# Patient Record
Sex: Female | Born: 1985 | Race: White | Hispanic: No | Marital: Single | Smoking: Current every day smoker
Health system: Southern US, Community
[De-identification: ages and names within clinical notes are randomized; demographics above are authoritative.]

## PROBLEM LIST (undated history)

## (undated) DIAGNOSIS — C419 Malignant neoplasm of bone and articular cartilage, unspecified: Secondary | ICD-10-CM

## (undated) DIAGNOSIS — Z765 Malingerer [conscious simulation]: Secondary | ICD-10-CM

## (undated) DIAGNOSIS — G8929 Other chronic pain: Secondary | ICD-10-CM

## (undated) DIAGNOSIS — F1120 Opioid dependence, uncomplicated: Secondary | ICD-10-CM

## (undated) DIAGNOSIS — B1920 Unspecified viral hepatitis C without hepatic coma: Secondary | ICD-10-CM

## (undated) DIAGNOSIS — N200 Calculus of kidney: Secondary | ICD-10-CM

## (undated) DIAGNOSIS — F429 Obsessive-compulsive disorder, unspecified: Secondary | ICD-10-CM

## (undated) DIAGNOSIS — N289 Disorder of kidney and ureter, unspecified: Secondary | ICD-10-CM

## (undated) DIAGNOSIS — F419 Anxiety disorder, unspecified: Secondary | ICD-10-CM

## (undated) DIAGNOSIS — F32A Depression, unspecified: Secondary | ICD-10-CM

## (undated) DIAGNOSIS — F329 Major depressive disorder, single episode, unspecified: Secondary | ICD-10-CM

## (undated) HISTORY — PX: LITHOTRIPSY: SUR834

## (undated) HISTORY — DX: Unspecified viral hepatitis C without hepatic coma: B19.20

## (undated) HISTORY — PX: LEG SURGERY: SHX1003

## (undated) HISTORY — PX: BONE CYST EXCISION: SHX376

## (undated) HISTORY — PX: HIP SURGERY: SHX245

## (undated) HISTORY — PX: CYSTOSCOPY: SUR368

---

## 2008-02-25 ENCOUNTER — Telehealth (INDEPENDENT_AMBULATORY_CARE_PROVIDER_SITE_OTHER): Payer: Self-pay | Admitting: *Deleted

## 2008-02-25 ENCOUNTER — Ambulatory Visit: Payer: Self-pay | Admitting: Family Medicine

## 2008-02-29 ENCOUNTER — Telehealth: Payer: Self-pay | Admitting: Occupational Medicine

## 2008-03-01 ENCOUNTER — Ambulatory Visit: Payer: Self-pay | Admitting: Occupational Medicine

## 2008-06-26 ENCOUNTER — Emergency Department (HOSPITAL_COMMUNITY): Admission: EM | Admit: 2008-06-26 | Discharge: 2008-06-26 | Payer: Self-pay | Admitting: Emergency Medicine

## 2008-07-16 ENCOUNTER — Emergency Department (HOSPITAL_BASED_OUTPATIENT_CLINIC_OR_DEPARTMENT_OTHER): Admission: EM | Admit: 2008-07-16 | Discharge: 2008-07-16 | Payer: Self-pay | Admitting: Emergency Medicine

## 2008-07-16 ENCOUNTER — Ambulatory Visit: Payer: Self-pay | Admitting: Diagnostic Radiology

## 2008-12-09 ENCOUNTER — Emergency Department (HOSPITAL_COMMUNITY): Admission: EM | Admit: 2008-12-09 | Discharge: 2008-12-09 | Payer: Self-pay | Admitting: Emergency Medicine

## 2009-07-16 ENCOUNTER — Emergency Department (HOSPITAL_BASED_OUTPATIENT_CLINIC_OR_DEPARTMENT_OTHER): Admission: EM | Admit: 2009-07-16 | Discharge: 2009-07-16 | Payer: Self-pay | Admitting: Emergency Medicine

## 2009-07-17 ENCOUNTER — Emergency Department (HOSPITAL_COMMUNITY): Admission: EM | Admit: 2009-07-17 | Discharge: 2009-07-17 | Payer: Self-pay | Admitting: Emergency Medicine

## 2009-07-21 ENCOUNTER — Emergency Department (HOSPITAL_BASED_OUTPATIENT_CLINIC_OR_DEPARTMENT_OTHER): Admission: EM | Admit: 2009-07-21 | Discharge: 2009-07-21 | Payer: Self-pay | Admitting: Emergency Medicine

## 2010-08-29 LAB — URINE CULTURE
Colony Count: NO GROWTH
Culture: NO GROWTH

## 2010-08-29 LAB — URINALYSIS, ROUTINE W REFLEX MICROSCOPIC
Bilirubin Urine: NEGATIVE
Bilirubin Urine: NEGATIVE
Glucose, UA: NEGATIVE mg/dL
Glucose, UA: NEGATIVE mg/dL
Hgb urine dipstick: NEGATIVE
Hgb urine dipstick: NEGATIVE
Ketones, ur: NEGATIVE mg/dL
Ketones, ur: NEGATIVE mg/dL
Nitrite: NEGATIVE
Nitrite: NEGATIVE
Protein, ur: NEGATIVE mg/dL
Protein, ur: NEGATIVE mg/dL
Specific Gravity, Urine: 1.004 — ABNORMAL LOW (ref 1.005–1.030)
Specific Gravity, Urine: 1.004 — ABNORMAL LOW (ref 1.005–1.030)
Urobilinogen, UA: 0.2 mg/dL (ref 0.0–1.0)
Urobilinogen, UA: 0.2 mg/dL (ref 0.0–1.0)
pH: 6 (ref 5.0–8.0)
pH: 6.5 (ref 5.0–8.0)

## 2010-08-29 LAB — URINE MICROSCOPIC-ADD ON

## 2010-08-29 LAB — PREGNANCY, URINE: Preg Test, Ur: NEGATIVE

## 2010-09-17 LAB — POCT PREGNANCY, URINE: Preg Test, Ur: NEGATIVE

## 2010-09-17 LAB — URINALYSIS, ROUTINE W REFLEX MICROSCOPIC
Bilirubin Urine: NEGATIVE
Glucose, UA: NEGATIVE mg/dL
Ketones, ur: NEGATIVE mg/dL
Nitrite: NEGATIVE
Protein, ur: NEGATIVE mg/dL
Specific Gravity, Urine: 1.022 (ref 1.005–1.030)
Urobilinogen, UA: 1 mg/dL (ref 0.0–1.0)
pH: 6 (ref 5.0–8.0)

## 2010-09-17 LAB — URINE MICROSCOPIC-ADD ON

## 2010-09-24 LAB — URINALYSIS, ROUTINE W REFLEX MICROSCOPIC
Bilirubin Urine: NEGATIVE
Glucose, UA: NEGATIVE mg/dL
Ketones, ur: NEGATIVE mg/dL
Leukocytes, UA: NEGATIVE
Nitrite: NEGATIVE
Protein, ur: NEGATIVE mg/dL
Specific Gravity, Urine: 1.028 (ref 1.005–1.030)
Urobilinogen, UA: 1 mg/dL (ref 0.0–1.0)
pH: 6.5 (ref 5.0–8.0)

## 2010-09-24 LAB — URINE MICROSCOPIC-ADD ON

## 2010-09-24 LAB — POCT PREGNANCY, URINE: Preg Test, Ur: NEGATIVE

## 2010-09-25 LAB — URINALYSIS, ROUTINE W REFLEX MICROSCOPIC
Bilirubin Urine: NEGATIVE
Glucose, UA: NEGATIVE mg/dL
Ketones, ur: NEGATIVE mg/dL
Leukocytes, UA: NEGATIVE
Nitrite: NEGATIVE
Protein, ur: NEGATIVE mg/dL
Specific Gravity, Urine: 1.027 (ref 1.005–1.030)
Urobilinogen, UA: 1 mg/dL (ref 0.0–1.0)
pH: 6 (ref 5.0–8.0)

## 2010-09-25 LAB — URINE MICROSCOPIC-ADD ON

## 2010-09-25 LAB — PREGNANCY, URINE: Preg Test, Ur: NEGATIVE

## 2011-06-11 DIAGNOSIS — B1920 Unspecified viral hepatitis C without hepatic coma: Secondary | ICD-10-CM

## 2011-06-11 HISTORY — DX: Unspecified viral hepatitis C without hepatic coma: B19.20

## 2012-01-29 ENCOUNTER — Ambulatory Visit (INDEPENDENT_AMBULATORY_CARE_PROVIDER_SITE_OTHER): Payer: Self-pay | Admitting: Sports Medicine

## 2012-01-29 ENCOUNTER — Encounter: Payer: Self-pay | Admitting: Sports Medicine

## 2012-01-29 VITALS — BP 109/68 | HR 90 | Temp 98.4°F | Resp 18 | Ht 62.5 in | Wt 103.0 lb

## 2012-01-29 DIAGNOSIS — M549 Dorsalgia, unspecified: Secondary | ICD-10-CM

## 2012-01-29 DIAGNOSIS — G8929 Other chronic pain: Secondary | ICD-10-CM

## 2012-01-29 DIAGNOSIS — F39 Unspecified mood [affective] disorder: Secondary | ICD-10-CM

## 2012-01-29 MED ORDER — OXYMORPHONE HCL 5 MG PO TABS: 5.0000 mg | ORAL_TABLET | ORAL | Status: DC | PRN

## 2012-01-29 MED ORDER — CITALOPRAM HYDROBROMIDE 10 MG PO TABS: 10.0000 mg | ORAL_TABLET | Freq: Every day | ORAL | Status: DC

## 2012-01-29 MED ORDER — OXYCODONE HCL 15 MG PO TABS: 15.0000 mg | ORAL_TABLET | Freq: Four times a day (QID) | ORAL | Status: DC | PRN

## 2012-01-29 MED ORDER — OXYCODONE HCL 15 MG PO TABS: 15.0000 mg | ORAL_TABLET | ORAL | Status: DC | PRN

## 2012-01-29 MED ORDER — OXYMORPHONE HCL 5 MG PO TABS: 5.0000 mg | ORAL_TABLET | Freq: Two times a day (BID) | ORAL | Status: AC

## 2012-01-29 NOTE — Progress Notes (Signed)
Patient ID: Beth Berg, female   DOB: 02-20-1986, 26 y.o.   MRN: [id number] Subjective:    CC: Establish care.   HPI: Beth Berg is a pleasant 26 year old female who comes in to establish care, as well as discuss her back pain. 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 Bone graft was taken from her left iliac wing. This was all performed at [facility].Since then she's had persistent pain, to the point where she has been on high doses of narcotic pain medication. She has come down on her dosages and is currently taking oxycodone 15 mg immediate release 4 times a day, and opana 5 mg twice a day.  She has had physical therapy, MRIs, injections, none of which have helped. Currently she has an appointment with the pain clinic on September 5. She's desiring refills on her pain medication until then.  Mood disorder: She has been on Zoloft 50 mg twice a day, but unfortunately has been off of it recently. She endorses a large amount of anxiety, as well as depressed mood. She denies flight of ideas, manic thoughts, decreased need for sleep, or spending outside of means.  She denied suicidal or homicidal ideation.  Past medical history, Surgical history, Family history, Social history, Allergies, and medications have been entered into the medical record, reviewed, and no changes needed.   Review of Systems: No headache, visual changes, nausea, vomiting, diarrhea, constipation, dizziness, abdominal pain, skin rash, fevers, chills, night sweats, weight loss, chest pain, body aches, joint swelling, muscle aches, or shortness of breath.   Objective:    General: Well Developed, well nourished, and in no acute distress.  Neuro: Alert and oriented x3, extra-ocular muscles intact.  HEENT: Normocephalic, atraumatic, pupils equal round reactive to light, neck supple, no masses, no lymphadenopathy, thyroid nonpalpable.  Skin: Warm and dry, no rashes noted.  Cardiac:  Regular rate and rhythm, no murmurs rubs or gallops.  Respiratory: Clear to auscultation bilaterally. Not using accessory muscles, speaking in full sentences.  Abdominal: Soft, nontender, nondistended, positive bowel sounds, no masses, no organomegaly.  Musculoskeletal: incisions noted on distal fibula, as well as left-sided back from bone graft. She endorses exquisite tenderness over these areas.  Impression and Recommendations:

## 2012-01-29 NOTE — Addendum Note (Signed)
Addended by: Monica Becton on: 01/29/2012 04:30 PM   Modules accepted: Medications

## 2012-01-29 NOTE — Assessment & Plan Note (Signed)
Mixed depression/anxiety. I will start citalopram 10 mg daily. She will come back to see me in 2 weeks to reassess her symptoms, and possibly titrate medication dose.

## 2012-01-29 NOTE — Assessment & Plan Note (Addendum)
Certainly she's become tolerant to narcotic medications. I would like to help her out with her pain, I am refilling her opana, and oxycodone. She understands that these are one time only prescription. She's been given a month's supply, and understands from our conversation the office visit that she will continue this at the pain clinic. I think that her pain will also improve significantly with her mood disorder under control with an SSRI. I have asked her to call [facility], and get her injection records.

## 2012-01-30 ENCOUNTER — Telehealth: Payer: Self-pay

## 2012-01-30 NOTE — Telephone Encounter (Signed)
Discussed with patient.  She requested additional Opana.  Advised it was my policy for one time scripts for non-acute pain.  She states she has pain clinic appointment scheduled for approximately 2 weeks. She will keep her appointment with me afterwards. She understands no additional narcotics from this practice. She also understands I will not prescribe Valium without trial of other medication.

## 2012-01-30 NOTE — Telephone Encounter (Signed)
She wants to talk to you about the celexa, the pain medication and about her job. She would not tell me anything else. She wants to talk with Dr Benjamin Stain.

## 2012-02-14 ENCOUNTER — Ambulatory Visit: Payer: Self-pay | Admitting: Sports Medicine

## 2012-09-06 ENCOUNTER — Emergency Department (HOSPITAL_BASED_OUTPATIENT_CLINIC_OR_DEPARTMENT_OTHER)
Admission: EM | Admit: 2012-09-06 | Discharge: 2012-09-06 | Disposition: A | Discharge status: Home / Self Care | Payer: Self-pay | Attending: Emergency Medicine | Admitting: Emergency Medicine

## 2012-09-06 ENCOUNTER — Encounter (HOSPITAL_BASED_OUTPATIENT_CLINIC_OR_DEPARTMENT_OTHER): Payer: Self-pay

## 2012-09-06 DIAGNOSIS — M25559 Pain in unspecified hip: Secondary | ICD-10-CM | POA: Insufficient documentation

## 2012-09-06 DIAGNOSIS — M25552 Pain in left hip: Secondary | ICD-10-CM

## 2012-09-06 DIAGNOSIS — G8929 Other chronic pain: Secondary | ICD-10-CM | POA: Insufficient documentation

## 2012-09-06 DIAGNOSIS — F17200 Nicotine dependence, unspecified, uncomplicated: Secondary | ICD-10-CM | POA: Insufficient documentation

## 2012-09-06 DIAGNOSIS — Z9889 Other specified postprocedural states: Secondary | ICD-10-CM | POA: Insufficient documentation

## 2012-09-06 DIAGNOSIS — Z85830 Personal history of malignant neoplasm of bone: Secondary | ICD-10-CM | POA: Insufficient documentation

## 2012-09-06 HISTORY — DX: Malignant neoplasm of bone and articular cartilage, unspecified: C41.9

## 2012-09-06 MED ORDER — LORAZEPAM 1 MG PO TABS: 1.0000 mg | ORAL_TABLET | Freq: Three times a day (TID) | ORAL | Status: AC | PRN

## 2012-09-06 MED ORDER — OXYCODONE-ACETAMINOPHEN 5-325 MG PO TABS
2.0000 | ORAL_TABLET | Freq: Once | ORAL | Status: AC
  Administered 2012-09-06: 2 via ORAL
  Filled 2012-09-06 (×2): qty 2

## 2012-09-06 NOTE — ED Notes (Signed)
Patient reports that she has lengthy hx of osteosarcoma and just got married yesterday and developed severe left hip pain for the past 6 hours. Here for pain management, to start pain clinic 1 week

## 2012-09-06 NOTE — ED Provider Notes (Signed)
History     CSN: [id number]  Arrival date & time 09/06/12  0629   First MD Initiated Contact with Patient 09/06/12 [phone]      Chief Complaint  Patient presents with  . Hip Pain     Patient is a 27 y.o. female presenting with hip pain. The history is provided by the patient.  Hip Pain This is a chronic problem. The current episode started more than 1 week ago. The problem occurs constantly. The problem has been gradually worsening. Pertinent negatives include no chest pain and no shortness of breath. Exacerbated by: walking. The symptoms are relieved by rest. She has tried rest for the symptoms. The treatment provided no relief.  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 She reports she just got married yesterday and is flying to [place] in two hours for her honeymoon.  She reports she will f/u with pain clinic in one week when she returns from [place].    She denies falls.  She denies recent injury to her hips.  She can ambulate.  No fever.  No weakness reported.  No abdominal pain reported.    Past Medical History  Diagnosis Date  . Osteosarcoma     History reviewed. No pertinent past surgical history.  No family history on file.  History  Substance Use Topics  . Smoking status: Current Every Day Smoker    Types: Cigarettes  . Smokeless tobacco: Not on file  . Alcohol Use: No    OB History   Grav Para Term Preterm Abortions TAB SAB Ect Mult Living                  Review of Systems  Constitutional: Negative for fever.  Respiratory: Negative for shortness of breath.   Cardiovascular: Negative for chest pain.  Musculoskeletal: Negative for back pain and joint swelling.  Neurological: Negative for weakness.    Allergies  Zofran  Home Medications   Current Outpatient Rx  Name  Route   Sig  Dispense  Refill  . ALPRAZolam (XANAX) 1 MG tablet   Oral   Take 1 mg by mouth at bedtime as needed for sleep.         Marland Kitchen oxyCODONE (ROXICODONE) 15 MG immediate release tablet   Oral   Take 15 mg by mouth every 6 (six) hours as needed for pain.         Marland Kitchen LORazepam (ATIVAN) 1 MG tablet   Oral   Take 1 tablet (1 mg total) by mouth every 8 (eight) hours as needed for anxiety.   10 tablet   0     BP 127/72  Pulse 123  Temp(Src) 97.5 F (36.4 C) (Oral)  SpO2 100%  Physical Exam CONSTITUTIONAL: Well developed/well nourished, mildly anxious HEAD: Normocephalic/atraumatic EYES: EOMI/PERRL ENMT: Mucous membranes moist NECK: supple no meningeal signs SPINE:entire spine nontender CV: S1/S2 noted, no murmurs/rubs/gallops noted LUNGS: Lungs are clear to auscultation bilaterally, no apparent distress ABDOMEN: soft, nontender, no rebound or guarding GU:no cva tenderness NEURO: Pt is awake/alert, moves all extremitiesx4. Pt can ambulate without difficulty EXTREMITIES: pulses normal, full ROM. No deformity noted to left LE.  Full ROM of left hip and left knee does not elicit any tenderness.  No warmth noted over left hip.  SKIN: warm, color normal PSYCH: mildly anxious  ED Course  Procedures    1. 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  She is tachycardic, but suspect due to anxiety/pain        Joya Gaskins, MD 09/06/12 [phone]

## 2013-02-21 ENCOUNTER — Emergency Department (HOSPITAL_BASED_OUTPATIENT_CLINIC_OR_DEPARTMENT_OTHER)
Admission: EM | Admit: 2013-02-21 | Discharge: 2013-02-21 | Discharge status: Against Medical Advice | Payer: Self-pay | Attending: Emergency Medicine | Admitting: Emergency Medicine

## 2013-02-21 ENCOUNTER — Encounter (HOSPITAL_BASED_OUTPATIENT_CLINIC_OR_DEPARTMENT_OTHER): Payer: Self-pay | Admitting: Emergency Medicine

## 2013-02-21 DIAGNOSIS — M549 Dorsalgia, unspecified: Secondary | ICD-10-CM | POA: Insufficient documentation

## 2013-02-21 DIAGNOSIS — M25559 Pain in unspecified hip: Secondary | ICD-10-CM | POA: Insufficient documentation

## 2013-02-21 DIAGNOSIS — Z87448 Personal history of other diseases of urinary system: Secondary | ICD-10-CM | POA: Insufficient documentation

## 2013-02-21 DIAGNOSIS — G8929 Other chronic pain: Secondary | ICD-10-CM | POA: Insufficient documentation

## 2013-02-21 DIAGNOSIS — F17200 Nicotine dependence, unspecified, uncomplicated: Secondary | ICD-10-CM | POA: Insufficient documentation

## 2013-02-21 DIAGNOSIS — Z880 Allergy status to penicillin: Secondary | ICD-10-CM | POA: Insufficient documentation

## 2013-02-21 DIAGNOSIS — Z859 Personal history of malignant neoplasm, unspecified: Secondary | ICD-10-CM | POA: Insufficient documentation

## 2013-02-21 HISTORY — DX: Disorder of kidney and ureter, unspecified: N28.9

## 2013-02-21 NOTE — ED Notes (Signed)
Urine specimen requested patient refused and then departed AMA. When asked if she was leaving she stated that she was going out and will ome back . Pt was informed that if she departed that she would be discharge and she continued to the parking lot and departed.

## 2013-02-21 NOTE — ED Notes (Signed)
Pt reports left flank and hip pain that started x 3 days hx of kidney stones, nephritis, and bone graph Left hip
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
## 2013-05-20 ENCOUNTER — Ambulatory Visit (INDEPENDENT_AMBULATORY_CARE_PROVIDER_SITE_OTHER): Payer: Self-pay | Admitting: Family Medicine

## 2013-05-20 ENCOUNTER — Encounter: Payer: Self-pay | Admitting: Family Medicine

## 2013-05-20 ENCOUNTER — Encounter: Payer: Self-pay | Admitting: Lab

## 2013-05-20 VITALS — BP 120/80 | HR 112 | Temp 97.9°F | Ht 62.75 in | Wt 100.4 lb

## 2013-05-20 DIAGNOSIS — C419 Malignant neoplasm of bone and articular cartilage, unspecified: Secondary | ICD-10-CM | POA: Insufficient documentation

## 2013-05-20 DIAGNOSIS — M549 Dorsalgia, unspecified: Secondary | ICD-10-CM

## 2013-05-20 DIAGNOSIS — G8929 Other chronic pain: Secondary | ICD-10-CM

## 2013-05-20 DIAGNOSIS — B182 Chronic viral hepatitis C: Secondary | ICD-10-CM | POA: Insufficient documentation

## 2013-05-20 MED ORDER — OXYCODONE HCL 15 MG PO TABS: 15.0000 mg | ORAL_TABLET | Freq: Four times a day (QID) | ORAL | Status: AC | PRN

## 2013-05-20 NOTE — Progress Notes (Signed)
   Subjective:    Patient ID: Beth Berg, female    DOB: 1985/08/29, 27 y.o.   MRN: [id number]  HPI New to establish.  No previous PCP.  Chronic back pain- multiple back and leg surgeries due to osteosarcoma.  Pain is localized to pt's L hip.  'constant stabbing pain'.  Seeing Ortho at [facility], Oncology at [facility], [facility]- but states she's unable to afford to follow regularly.  Waiting on Medicaid to start for pt's disability- states she has been approved.  Pt has been getting pain meds from emergency room.  Pt reports med regimen is Opana twice daily and 4 Oxycodone daily but states she's not actually taking the Opana- 'it doesn't work'.  Reports she can't take tylenol due to Hep C.  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  Has not been to Hep Clinic.  Pt has been told she has liver damage and needs treatment.   Review of Systems For ROS see HPI     Objective:   Physical Exam  Vitals reviewed. Constitutional: She is oriented to person, place, and time.  thin  HENT:  Poor dentition  Cardiovascular: Normal rate, regular rhythm, normal heart sounds and intact distal pulses.   Pulmonary/Chest: Effort normal and breath sounds normal. No respiratory distress. She has no wheezes. She has no rales.  Musculoskeletal: She exhibits no edema.  Neurological: She is alert and oriented to person, place, and time.  Skin: Skin is warm and dry.  Psychiatric: She has a normal mood and affect. Her behavior is normal. Thought content normal.          Assessment & Plan:

## 2013-05-20 NOTE — Progress Notes (Signed)
Pre visit review using our clinic review tool, if applicable. No additional management support is needed unless otherwise documented below in the visit note. 

## 2013-05-20 NOTE — Patient Instructions (Signed)
Schedule your complete physical in 3 months We'll notify you of your lab results and make any changes if needed We'll call you with your pain clinic appt Call with any questions or concerns Happy Holidays!!!

## 2013-05-20 NOTE — Assessment & Plan Note (Signed)
New to provider.  Pt reports she was dx'd during pregnancy but has never received tx.  Get labs to assess and if viral load + will refer to Hep C clinic.  Pt prefers [facility] location.

## 2013-05-20 NOTE — Assessment & Plan Note (Signed)
New to provider, pt has hx of multiple surgeries per report.  No records available today.

## 2013-05-20 NOTE — Assessment & Plan Note (Signed)
New to provider.  Reviewed controlled substance data base for both of pt's names Beth Berg and [name]- in the last 6 months, pt has received controlled substances from at least 24 providers.  Reports these have come from UC's and ER's b/c she has not found a PCP who will manage her pain.  Instructed pt that I will not manage chronic pain either and that she will need a pain management referral.  Pt in agreement w/ pain referral b/c she said she had been approved for Medicaid and is just waiting for it to take effect.  Pt signed controlled substance agreement but became suspicious when I informed her of the drug test- 'what are you looking for?  Just my oxycodone, right?'  Pt was given script for 120 Oxycodone but was not given Opana b/c pt reported this was ineffective and she was not taking it regularly.  When pt was speaking w/ referral coordinator she stated she was applying for Medicare, not medicaid, and that she hadn't been approved and that she can't afford to see Pain Management.  Pt declined appt.  I told referral coordinator to schedule pt anyway based on the discussion we had during our OV- pt was aware that if she doesn't go, she will be noncompliant w/ our tx plan and subject to dismissal.

## 2013-05-21 LAB — HEPATIC FUNCTION PANEL
ALT: 29 U/L (ref 0–35)
AST: 25 U/L (ref 0–37)
Albumin: 4.4 g/dL (ref 3.5–5.2)
Alkaline Phosphatase: 67 U/L (ref 39–117)
Bilirubin, Direct: 0.1 mg/dL (ref 0.0–0.3)
Total Bilirubin: 0.6 mg/dL (ref 0.3–1.2)
Total Protein: 7.5 g/dL (ref 6.0–8.3)

## 2013-05-24 ENCOUNTER — Encounter: Payer: Self-pay | Admitting: General Practice

## 2013-05-24 ENCOUNTER — Telehealth: Payer: Self-pay | Admitting: *Deleted

## 2013-05-24 ENCOUNTER — Other Ambulatory Visit: Payer: Self-pay | Admitting: Family Medicine

## 2013-05-24 ENCOUNTER — Telehealth: Payer: Self-pay | Admitting: Family Medicine

## 2013-05-24 DIAGNOSIS — B1920 Unspecified viral hepatitis C without hepatic coma: Secondary | ICD-10-CM

## 2013-05-24 LAB — HEPATITIS C RNA QUANTITATIVE
HCV Quantitative Log: 5.49 {Log} — ABNORMAL HIGH (ref <1.18)
HCV Quantitative: 310981 [IU]/mL — ABNORMAL HIGH (ref <15)

## 2013-05-24 NOTE — Telephone Encounter (Signed)
Patient called the office again sobbing stating that she "did not want to lose a doctor she just started seeing." I again reminded the patient that this had already been discussed and that Dr. Beverely Low will not prescribe any additional medication for her. Patient was again reminder that she was to follow up with the pain clinic, which she refused due to cost. Patient advised that recent UDS showed a significant amount of Opana even though she told the provider she "only takes it occasionally." Provider deemed patient high risk due to results not being consistent with conversation. Patient advised that due to her dishonesty with the provider she would not longer be a patient here and that she needed to stop making phone calls to our office. Patient advised that she was harassing our staff with the multiple phone calls and she was not to call back. Provider aware of situation and is in agreement.

## 2013-05-24 NOTE — Telephone Encounter (Signed)
Patient is calling back in regards to request below.

## 2013-05-24 NOTE — Telephone Encounter (Signed)
Patient called again for the 5th time today to explain that she just wants her Opana refilled. She does not want an increase on her Oxycodone. Also patient was upset on the phone and crying that she feels like this is all a big misunderstanding.

## 2013-05-24 NOTE — Telephone Encounter (Signed)
UDS results High Risk per provider. Pt positive for Opana per provider

## 2013-05-24 NOTE — Telephone Encounter (Signed)
Patient is calling back in regards to her requests below. Per message from Dr. Beverely Low below, I advised her that since her UDS results are not available she cannot have any medication changes. She then interrupted me and said "No I don't want my medication change what about my Opana?" I reiterated to the patient what she told me when she called this morning about her wanting her Oxycodone 15mg  rx dosage increased. She then said "well what about my Opana? I cannot afford to go to the pain clinic because it is $400."  Told patient that I would forward her message to Dr. Beverely Low.

## 2013-05-24 NOTE — Telephone Encounter (Signed)
Spoke with patient who was crying stating "that all this is a huge misunderstanding, Dr. Beverely Low told me that I could call the office if the Oxycodone did not work well and get Opana." I advised the patient that there was not a note suggesting that from Dr. Beverely Low. I also advised the patient that she needs to follow up at the pain clinic but she stated she"could not afford that so she was not going." Patient went on stating that she "thinks her wrist is broken" at which time I advised the patient to seek treatment in the ER. UDS results available patient is High Risk as she is positive for Opana.

## 2013-05-24 NOTE — Telephone Encounter (Signed)
Pt has called nearly 10 times today with the same demands after being told multiple times that we are not going to provide her with the medication she is requesting.  She has changed her story multiple times, has not paid for her first visit, and was found to be positive for the opana that she told me she wasn't taking.  She was loud enough on the phone for pt's in the office to hear her and that abusive behavior will not be tolerated.  At this time, she is harassing me and my staff about a controlled substance that she is not entitled to (she was given 120 oxy on Thursday 12/11 so she is in no danger of withdrawal.  At this point, pt is dismissed.

## 2013-05-24 NOTE — Telephone Encounter (Signed)
Patient called again for the 4th time today. She wanted to let me know that she had spoke with someone in billing to pay for her OV and also wanted to let me know that she needed to speak with Dr. Beverely Low today. She states "I feel that my messages to her are getting mixed up and I do not want her to think I am asking for an increase in my medication because I just want my Opana." She then proceeded to ask me if I could guarantee that she would be called back today by Dr. Beverely Low. I told patient that I could not guarantee a call back from her physician today because she is busy seeing patient's this afternoon but that someone would follow up with her in a timely manner.  Patient then started crying and asked why cant we just give her the Opana rx because she is in so much pain and cannot afford pain management.  She asked if she could have an appointment to see Dr. Beverely Low today about her wrist pain that she just injured a few minutes ago, per pt. I advised her that I had nothing available this afternoon with Dr. Beverely Low but could check to see if another doctor had something. She declined and asked me again about her Opana.   I told the patient that her and I have already discussed this and that her message has already been forwarded. Please advise.

## 2013-05-24 NOTE — Telephone Encounter (Signed)
Spoke with pt and advised that per Dr. Rennis Golden last OV note pt stated that the Opana was ineffective. Therefore Dr. Beverely Low gave pt an Rx for oxycodone. There are no notes for [place] stating pt could call back and receive that medication. Pt was advised that she would need to go to an ER or urgent care for imaging due to our lack of equipment in this office. Pt became beligerent at that time I got the Rn clinical lead to discuss Dr. Rennis Golden wishes with her further.

## 2013-05-24 NOTE — Telephone Encounter (Signed)
Patient is calling to request an rx for oxymorphone (OPANA) 5 MG tablet. She states that she was told to call when she needed it refilled. Pt is asking to pick it up today f possible. She is also wanting her oxyCODONE (ROXICODONE) 15 MG dose to be increased because she says it "does not work as well as it used to."  Patient wants Dr. Beverely Low to know that she has been in contact with [facility] but she is going to wait until after her Medicaid goes through to schedule an appointment and they probably will not be able to see her until February, per pt.

## 2013-05-24 NOTE — Telephone Encounter (Signed)
Do not have results of pt's UDS available yet so no medication changes at this time Also, pt told me she was already approved for insurance and would be able to see pain management- will NOT give prescriptions until February

## 2013-05-26 ENCOUNTER — Ambulatory Visit: Payer: Self-pay | Admitting: Family Medicine

## 2013-05-27 ENCOUNTER — Telehealth: Payer: Self-pay | Admitting: *Deleted

## 2013-05-27 NOTE — Telephone Encounter (Signed)
Provider recommends dismissal, letter printed and dismissal process started.

## 2013-05-31 ENCOUNTER — Telehealth: Payer: Self-pay | Admitting: Family Medicine

## 2013-05-31 NOTE — Telephone Encounter (Signed)
[facility] called to notify that the patient no-showed her appointment we scheduled for her.

## 2013-06-02 ENCOUNTER — Telehealth: Payer: Self-pay | Admitting: Family Medicine

## 2013-06-02 NOTE — Telephone Encounter (Signed)
Noted, pt is process of dismissal from practice.

## 2013-06-02 NOTE — Telephone Encounter (Addendum)
Patient dismissed from [facility] by Neena Rhymes MD , effective May 27, 2013. Dismissal letter sent out by certified / registered mail. DAJ  Received signed domestic return receipt verifying delivery of certified letter on June 07, 2013. Article number [id number]

## 2013-06-08 ENCOUNTER — Encounter: Payer: Self-pay | Admitting: Family Medicine

## 2013-06-18 ENCOUNTER — Telehealth: Payer: Self-pay | Admitting: *Deleted

## 2013-06-18 NOTE — Telephone Encounter (Signed)
Notified patient and she states that she has not received any medication since her last visit. Informed patient that will are unable to prescribed anymore medication at this time.

## 2013-06-18 NOTE — Telephone Encounter (Signed)
Pt received 120 Oxycodone from me on 12/11 Received 20 Oxycodone on 12/25 from Nancy Fetter Received 120 Oxycodone on 12/26 from Shanon Brow Minor  Based on this, ABSOLUTELY NO PAIN MEDS FROM THIS OFFICE.  She has enough to get to her pain clinic appt and beyond.

## 2013-06-18 NOTE — Telephone Encounter (Signed)
Patient called and stated that she would like to know if she could get a refill on her oxycodone. Patient states that she has an appointment on 06/24/2013 with the pain clinic. Patient states that the pain clinic told her that our office should be able to refill the medication because we are the one that referred her to them. Patient states that if she doesn't continue the medication then she will go through withdrawal. Please advise. SW

## 2013-07-22 ENCOUNTER — Encounter: Payer: Self-pay | Admitting: Family Medicine

## 2013-07-23 ENCOUNTER — Telehealth: Payer: Self-pay | Admitting: *Deleted

## 2013-07-23 NOTE — Telephone Encounter (Signed)
Spoke with patient after she called the office requesting another referral to another pain clinic because she "could not afford the ones recommended by Dr. Birdie Riddle." I informed the patient that due to the fact that she has been dismissed from our practice, we can no longer make a referral for her. I encouraged the patient to establish with another PCP and we would send her records as soon as we receive the release of records request. I further informed the patient that her new PCP could assist her with a referral to another pain clinic that she could afford. Patient requested her labs be read to her , this was done. I advised that I could mail a copy to her address if she preferred, pt asked that I not do that because "they have been having problems with their mail not getting to them." Patient then began to become very upset stating "you all have done nothing to help me and you are very rude and demeaning. Thank you for your time", pt then hung up.

## 2013-09-01 ENCOUNTER — Encounter: Payer: Self-pay | Admitting: Family Medicine

## 2014-08-21 ENCOUNTER — Encounter (HOSPITAL_BASED_OUTPATIENT_CLINIC_OR_DEPARTMENT_OTHER): Payer: Self-pay | Admitting: *Deleted

## 2014-08-21 ENCOUNTER — Emergency Department (HOSPITAL_BASED_OUTPATIENT_CLINIC_OR_DEPARTMENT_OTHER)
Admission: EM | Admit: 2014-08-21 | Discharge: 2014-08-21 | Disposition: A | Discharge status: Home / Self Care | Payer: Medicaid Other | Attending: Emergency Medicine | Admitting: Emergency Medicine

## 2014-08-21 DIAGNOSIS — Z87448 Personal history of other diseases of urinary system: Secondary | ICD-10-CM | POA: Diagnosis not present

## 2014-08-21 DIAGNOSIS — Z8619 Personal history of other infectious and parasitic diseases: Secondary | ICD-10-CM | POA: Insufficient documentation

## 2014-08-21 DIAGNOSIS — E876 Hypokalemia: Secondary | ICD-10-CM | POA: Diagnosis not present

## 2014-08-21 DIAGNOSIS — F1310 Sedative, hypnotic or anxiolytic abuse, uncomplicated: Secondary | ICD-10-CM | POA: Insufficient documentation

## 2014-08-21 DIAGNOSIS — Z85830 Personal history of malignant neoplasm of bone: Secondary | ICD-10-CM | POA: Insufficient documentation

## 2014-08-21 DIAGNOSIS — F419 Anxiety disorder, unspecified: Secondary | ICD-10-CM | POA: Insufficient documentation

## 2014-08-21 DIAGNOSIS — Z720 Tobacco use: Secondary | ICD-10-CM | POA: Diagnosis not present

## 2014-08-21 DIAGNOSIS — Z7289 Other problems related to lifestyle: Secondary | ICD-10-CM | POA: Insufficient documentation

## 2014-08-21 DIAGNOSIS — Z79899 Other long term (current) drug therapy: Secondary | ICD-10-CM | POA: Insufficient documentation

## 2014-08-21 DIAGNOSIS — Z87442 Personal history of urinary calculi: Secondary | ICD-10-CM | POA: Diagnosis not present

## 2014-08-21 DIAGNOSIS — Z880 Allergy status to penicillin: Secondary | ICD-10-CM | POA: Diagnosis not present

## 2014-08-21 DIAGNOSIS — R109 Unspecified abdominal pain: Secondary | ICD-10-CM | POA: Diagnosis present

## 2014-08-21 DIAGNOSIS — Z3202 Encounter for pregnancy test, result negative: Secondary | ICD-10-CM | POA: Insufficient documentation

## 2014-08-21 DIAGNOSIS — F1110 Opioid abuse, uncomplicated: Secondary | ICD-10-CM | POA: Diagnosis not present

## 2014-08-21 DIAGNOSIS — Z765 Malingerer [conscious simulation]: Secondary | ICD-10-CM

## 2014-08-21 HISTORY — DX: Anxiety disorder, unspecified: F41.9

## 2014-08-21 HISTORY — DX: Calculus of kidney: N20.0

## 2014-08-21 LAB — RAPID URINE DRUG SCREEN, HOSP PERFORMED
Amphetamines: NOT DETECTED
Barbiturates: NOT DETECTED
Benzodiazepines: POSITIVE — AB
Cocaine: NOT DETECTED
Opiates: POSITIVE — AB
Tetrahydrocannabinol: NOT DETECTED

## 2014-08-21 LAB — CBC WITH DIFFERENTIAL/PLATELET
Basophils Absolute: 0 10*3/uL (ref 0.0–0.1)
Basophils Relative: 0 % (ref 0–1)
Eosinophils Absolute: 0.2 10*3/uL (ref 0.0–0.7)
Eosinophils Relative: 2 % (ref 0–5)
HCT: 35.6 % — ABNORMAL LOW (ref 36.0–46.0)
Hemoglobin: 10.8 g/dL — ABNORMAL LOW (ref 12.0–15.0)
Lymphocytes Relative: 40 % (ref 12–46)
Lymphs Abs: 3.2 10*3/uL (ref 0.7–4.0)
MCH: 30.1 pg (ref 26.0–34.0)
MCHC: 30.3 g/dL (ref 30.0–36.0)
MCV: 99.2 fL (ref 78.0–100.0)
Monocytes Absolute: 0.5 10*3/uL (ref 0.1–1.0)
Monocytes Relative: 6 % (ref 3–12)
Neutro Abs: 4.1 10*3/uL (ref 1.7–7.7)
Neutrophils Relative %: 52 % (ref 43–77)
Platelets: 461 10*3/uL — ABNORMAL HIGH (ref 150–400)
RBC: 3.59 MIL/uL — ABNORMAL LOW (ref 3.87–5.11)
RDW: 14.3 % (ref 11.5–15.5)
WBC: 7.9 10*3/uL (ref 4.0–10.5)

## 2014-08-21 LAB — COMPREHENSIVE METABOLIC PANEL
ALT: 12 U/L (ref 0–35)
AST: 16 U/L (ref 0–37)
Albumin: 4.3 g/dL (ref 3.5–5.2)
Alkaline Phosphatase: 76 U/L (ref 39–117)
Anion gap: 6 (ref 5–15)
BUN: 11 mg/dL (ref 6–23)
CO2: 27 mmol/L (ref 19–32)
Calcium: 8.9 mg/dL (ref 8.4–10.5)
Chloride: 103 mmol/L (ref 96–112)
Creatinine, Ser: 0.6 mg/dL (ref 0.50–1.10)
GFR calc Af Amer: >90 mL/min (ref >90)
GFR calc non Af Amer: >90 mL/min (ref >90)
Glucose, Bld: 81 mg/dL (ref 70–99)
Potassium: 3.1 mmol/L — ABNORMAL LOW (ref 3.5–5.1)
Sodium: 136 mmol/L (ref 135–145)
Total Bilirubin: 1 mg/dL (ref 0.3–1.2)
Total Protein: 6.9 g/dL (ref 6.0–8.3)

## 2014-08-21 LAB — URINALYSIS, ROUTINE W REFLEX MICROSCOPIC
Glucose, UA: NEGATIVE mg/dL
Hgb urine dipstick: NEGATIVE
Ketones, ur: 15 mg/dL — AB
Leukocytes, UA: NEGATIVE
Nitrite: NEGATIVE
Protein, ur: NEGATIVE mg/dL
Specific Gravity, Urine: 1.03 (ref 1.005–1.030)
Urobilinogen, UA: 1 mg/dL (ref 0.0–1.0)
pH: 6 (ref 5.0–8.0)

## 2014-08-21 LAB — PREGNANCY, URINE: Preg Test, Ur: NEGATIVE

## 2014-08-21 MED ORDER — POTASSIUM CHLORIDE CRYS ER 20 MEQ PO TBCR: 40.0000 meq | EXTENDED_RELEASE_TABLET | Freq: Once | ORAL | Status: DC

## 2014-08-21 MED ORDER — ALPRAZOLAM 0.5 MG PO TABS
0.5000 mg | ORAL_TABLET | Freq: Once | ORAL | Status: AC
  Administered 2014-08-21: 0.5 mg via ORAL

## 2014-08-21 MED ORDER — ALPRAZOLAM 0.5 MG PO TABS
ORAL_TABLET | ORAL | Status: AC
  Administered 2014-08-21: 0.5 mg via ORAL
  Filled 2014-08-21: qty 1

## 2014-08-21 MED ORDER — PROMETHAZINE HCL 25 MG/ML IJ SOLN
INTRAMUSCULAR | Status: AC
  Administered 2014-08-21: 25 mg via INTRAVENOUS
  Filled 2014-08-21: qty 1

## 2014-08-21 MED ORDER — HYDROMORPHONE HCL 1 MG/ML IJ SOLN
1.0000 mg | Freq: Once | INTRAMUSCULAR | Status: AC
  Administered 2014-08-21: 1 mg via INTRAVENOUS

## 2014-08-21 MED ORDER — SODIUM CHLORIDE 0.9 % IV BOLUS (SEPSIS)
1000.0000 mL | Freq: Once | INTRAVENOUS | Status: AC
  Administered 2014-08-21: 1000 mL via INTRAVENOUS

## 2014-08-21 MED ORDER — HYDROMORPHONE HCL 1 MG/ML IJ SOLN
INTRAMUSCULAR | Status: AC
  Administered 2014-08-21: 1 mg via INTRAVENOUS
  Filled 2014-08-21: qty 1

## 2014-08-21 MED ORDER — PROMETHAZINE HCL 25 MG/ML IJ SOLN
25.0000 mg | Freq: Once | INTRAMUSCULAR | Status: AC
  Administered 2014-08-21: 25 mg via INTRAVENOUS

## 2014-08-21 NOTE — ED Notes (Addendum)
C/o left flank pain for past week, but worse today. C/o nausea and vomiting. Describes stabbing pain with urination. States hx of kidney stones. Has taken ibuprofen pta with no relief. States she took an "old oxycodone" from home today.

## 2014-08-21 NOTE — ED Notes (Signed)
Dr. Florina Ou at [facility] speaking with pt.

## 2014-08-21 NOTE — ED Notes (Signed)
Pt walked out after speaking with Dr. Florina Ou, declined further information or meds ordered, did not remove gown.

## 2014-08-21 NOTE — ED Notes (Addendum)
Pt alert, NAD, calm, interactive, resps e/u, grimacing, restless, polite, cooperative. C/o L abd & back pain, radiating into groin. Also nv and subjective fever. Onset 2d ago. (denies diarrhea). Rates 9/10. EDP at [facility]. Pending orders.  H/o kidney stones, last stone 2 months ago. Also h/o polycystic nephritis, kidney failure, 6 lithotripsies. Seen by MDs at [place].

## 2014-08-21 NOTE — ED Notes (Signed)
Pt sleeping, NAD, calm, VSS.

## 2014-08-21 NOTE — ED Notes (Signed)
Pt ambulatory back from b/r steady gait, states, "I need meds".

## 2014-08-21 NOTE — ED Provider Notes (Signed)
CSN: [id number]     Arrival date & time 08/21/14  0112 History   First MD Initiated Contact with Patient 08/21/14 0128     Chief Complaint  Patient presents with  . Flank Pain     (Consider location/radiation/quality/duration/timing/severity/associated sxs/prior Treatment) HPI  This is a 29 year old female who claims to have a history of "polycystic nephritis", kidney failure, pyelonephritis and passage of over 60 kidney stones and 6 lithotripsies.  She is here with a two-day history of left flank pain radiating to her left lower quadrant. She describes the pain is severe and somewhat worse with movement. It is been associated with hematuria, nausea and vomiting. She is also having a stabbing pain with urination. Pain is characterized as like that of past kidney stones. She has had a low-grade fever. She has taken ibuprofen without relief of the pain.  Past Medical History  Diagnosis Date  . Osteosarcoma   . Renal disorder   . Cancer   . Hepatitis C 2013  . Kidney stones   . Anxiety    Past Surgical History  Procedure Laterality Date  . Cystoscopy    . Lithotripsy    . Bone cyst excision      grafting with calcium sulfate pellets   Family History  Problem Relation Age of Onset  . Heart disease Father    History  Substance Use Topics  . Smoking status: Current Every Day Smoker -- 1.00 packs/day for 9 years    Types: Cigarettes  . Smokeless tobacco: Never Used  . Alcohol Use: No   OB History    No data available     Review of Systems  All other systems reviewed and are negative.   Allergies  Tylenol; Amoxicillin-pot clavulanate; Codeine; Hydrocodone; Penicillins; Rocephin; Toradol; Tramadol; Zofran; and Zofran  Home Medications   Prior to Admission medications   Medication Sig Start Date End Date Taking? Authorizing Provider  ribavirin (REBETOL) 200 MG capsule Take 200 mg by mouth. Eight times daily per pt   Yes Historical Provider, MD  ALPRAZolam Duanne Moron) 1 MG  tablet Take 1 mg by mouth at bedtime as needed for sleep.    Historical Provider, MD  LORazepam (ATIVAN) 1 MG tablet Take 1 tablet (1 mg total) by mouth every 8 (eight) hours as needed for anxiety. 09/06/12   Ripley Fraise, MD  oxyCODONE (ROXICODONE) 15 MG immediate release tablet Take 1 tablet (15 mg total) by mouth every 6 (six) hours as needed for pain. 05/20/13 09/09/14  Midge Minium, MD  oxyCODONE (ROXICODONE) 15 MG immediate release tablet Take 15 mg by mouth every 6 (six) hours as needed for pain.    Historical Provider, MD  oxymorphone (OPANA) 5 MG tablet Take 1 tablet (5 mg total) by mouth 2 (two) times daily. 01/29/12 05/20/13  Silverio Decamp, MD   BP 103/71 mmHg  Pulse 79  Temp(Src) 98.3 F (36.8 C) (Oral)  Resp 20  Ht 5\' 2"  (1.575 m)  Wt 104 lb (47.174 kg)  BMI 19.02 kg/m2  SpO2 99%  LMP 08/09/2014 (Approximate)   Physical Exam  General: Well-developed, thin female in no acute distress; appears older than age of record HENT: normocephalic; atraumatic; poor dentition Eyes: pupils equal, round and reactive to light; extraocular muscles intact Neck: supple Heart: regular rate and rhythm Lungs: clear to auscultation bilaterally Abdomen: soft; nondistended; left lower quadrant and suprapubic tenderness; no masses or hepatosplenomegaly; bowel sounds present GU: No CVA tenderness Extremities: No deformity; full range of  motion; pulses normal Neurologic: Awake, alert and oriented; motor function intact in all extremities and symmetric; no facial droop Skin: Warm and dry Psychiatric: Anxious    ED Course  Procedures (including critical care time)   MDM   Nursing notes and vitals signs, including pulse oximetry, reviewed.  Summary of this visit's results, reviewed by myself:  Labs:  Results for orders placed or performed during the hospital encounter of 08/21/14 (from the past 24 hour(s))  Urinalysis, Routine w reflex microscopic     Status: Abnormal    Collection Time: 08/21/14  1:38 AM  Result Value Ref Range   Color, Urine AMBER (A) YELLOW   APPearance CLOUDY (A) CLEAR   Specific Gravity, Urine 1.030 1.005 - 1.030   pH 6.0 5.0 - 8.0   Glucose, UA NEGATIVE NEGATIVE mg/dL   Hgb urine dipstick NEGATIVE NEGATIVE   Bilirubin Urine SMALL (A) NEGATIVE   Ketones, ur 15 (A) NEGATIVE mg/dL   Protein, ur NEGATIVE NEGATIVE mg/dL   Urobilinogen, UA 1.0 0.0 - 1.0 mg/dL   Nitrite NEGATIVE NEGATIVE   Leukocytes, UA NEGATIVE NEGATIVE  Pregnancy, urine     Status: None   Collection Time: 08/21/14  1:38 AM  Result Value Ref Range   Preg Test, Ur NEGATIVE NEGATIVE  Drug screen panel, emergency     Status: Abnormal   Collection Time: 08/21/14  1:38 AM  Result Value Ref Range   Opiates POSITIVE (A) NONE DETECTED   Cocaine NONE DETECTED NONE DETECTED   Benzodiazepines POSITIVE (A) NONE DETECTED   Amphetamines NONE DETECTED NONE DETECTED   Tetrahydrocannabinol NONE DETECTED NONE DETECTED   Barbiturates NONE DETECTED NONE DETECTED  Comprehensive metabolic panel     Status: Abnormal   Collection Time: 08/21/14  3:24 AM  Result Value Ref Range   Sodium 136 135 - 145 mmol/L   Potassium 3.1 (L) 3.5 - 5.1 mmol/L   Chloride 103 96 - 112 mmol/L   CO2 27 19 - 32 mmol/L   Glucose, Bld 81 70 - 99 mg/dL   BUN 11 6 - 23 mg/dL   Creatinine, Ser 0.60 0.50 - 1.10 mg/dL   Calcium 8.9 8.4 - 10.5 mg/dL   Total Protein 6.9 6.0 - 8.3 g/dL   Albumin 4.3 3.5 - 5.2 g/dL   AST 16 0 - 37 U/L   ALT 12 0 - 35 U/L   Alkaline Phosphatase 76 39 - 117 U/L   Total Bilirubin 1.0 0.3 - 1.2 mg/dL   GFR calc non Af Amer >90 >90 mL/min   GFR calc Af Amer >90 >90 mL/min   Anion gap 6 5 - 15  CBC with Differential/Platelet     Status: Abnormal   Collection Time: 08/21/14  3:24 AM  Result Value Ref Range   WBC 7.9 4.0 - 10.5 K/uL   RBC 3.59 (L) 3.87 - 5.11 MIL/uL   Hemoglobin 10.8 (L) 12.0 - 15.0 g/dL   HCT 35.6 (L) 36.0 - 46.0 %   MCV 99.2 78.0 - 100.0 fL   MCH 30.1 26.0  - 34.0 pg   MCHC 30.3 30.0 - 36.0 g/dL   RDW 14.3 11.5 - 15.5 %   Platelets 461 (H) 150 - 400 K/uL   Neutrophils Relative % 52 43 - 77 %   Neutro Abs 4.1 1.7 - 7.7 K/uL   Lymphocytes Relative 40 12 - 46 %   Lymphs Abs 3.2 0.7 - 4.0 K/uL   Monocytes Relative 6 3 - 12 %  Monocytes Absolute 0.5 0.1 - 1.0 K/uL   Eosinophils Relative 2 0 - 5 %   Eosinophils Absolute 0.2 0.0 - 0.7 K/uL   Basophils Relative 0 0 - 1 %   Basophils Absolute 0.0 0.0 - 0.1 K/uL   5:07 AM Patient somnolent after IV medications. The patient's Care Everywhere charts from [facility] and [facility] were reviewed. She has had multiple CT scans of the abdomen and pelvis as well as multiple renal ultrasounds dating back to 2009. On none of these studies was there evidence of ureterolithiasis or hydronephrosis. A single intrarenal stone was noted in 2009 and 2010. On her most recent CT, performed 3 weeks ago at [facility], her kidneys appeared normal and there was no evidence of hydronephrosis, nephrolithiasis or ureterolithiasis. Her urinalysis at the same time was unremarkable.  A review of her [facility] charts indicates that she was dismissed from her primary care physician's practice (Dr. Birdie Riddle) in 2014 due to violating her chronic pain contract by obtaining opioids from other providers.  6:28 AM The patient disputes the information noted above. Specifically she states that the medical records are wrong and that she hasn't fact had multiple kidney stones. She also denies ever being a patient of Dr. Birdie Riddle, nor ever being dismissed from her practice. The patient immediately eloped after this discussion; she did not wait for discharge papers or medication (KCl).     Shanon Rosser, MD 08/21/14 [phone]

## 2014-08-21 NOTE — ED Notes (Signed)
Time change occurring presently. Daylight savings time. 0159 to 0300.

## 2014-08-21 NOTE — ED Notes (Signed)
EDP and RN into room, pt sleeping, NAD, calm, VSS, sedated, unable to hold conversation.

## 2014-08-21 NOTE — ED Notes (Addendum)
Pt sleeping, NAD, calm, VSS.

## 2014-09-08 ENCOUNTER — Emergency Department
Admission: EM | Admit: 2014-09-08 | Discharge: 2014-09-08 | Discharge status: Absent without leave | Payer: Self-pay | Source: Home / Self Care

## 2015-05-23 ENCOUNTER — Emergency Department (HOSPITAL_BASED_OUTPATIENT_CLINIC_OR_DEPARTMENT_OTHER)
Admission: EM | Admit: 2015-05-23 | Discharge: 2015-05-23 | Discharge status: Against Medical Advice | Payer: Medicaid Other | Attending: Emergency Medicine | Admitting: Emergency Medicine

## 2015-05-23 ENCOUNTER — Encounter (HOSPITAL_BASED_OUTPATIENT_CLINIC_OR_DEPARTMENT_OTHER): Payer: Self-pay | Admitting: Emergency Medicine

## 2015-05-23 DIAGNOSIS — Z85830 Personal history of malignant neoplasm of bone: Secondary | ICD-10-CM | POA: Insufficient documentation

## 2015-05-23 DIAGNOSIS — Z79899 Other long term (current) drug therapy: Secondary | ICD-10-CM | POA: Diagnosis not present

## 2015-05-23 DIAGNOSIS — Z87442 Personal history of urinary calculi: Secondary | ICD-10-CM | POA: Diagnosis not present

## 2015-05-23 DIAGNOSIS — Z7289 Other problems related to lifestyle: Secondary | ICD-10-CM | POA: Diagnosis not present

## 2015-05-23 DIAGNOSIS — Z8619 Personal history of other infectious and parasitic diseases: Secondary | ICD-10-CM | POA: Diagnosis not present

## 2015-05-23 DIAGNOSIS — Z880 Allergy status to penicillin: Secondary | ICD-10-CM | POA: Insufficient documentation

## 2015-05-23 DIAGNOSIS — M25572 Pain in left ankle and joints of left foot: Secondary | ICD-10-CM | POA: Diagnosis present

## 2015-05-23 DIAGNOSIS — F17210 Nicotine dependence, cigarettes, uncomplicated: Secondary | ICD-10-CM | POA: Diagnosis not present

## 2015-05-23 DIAGNOSIS — Z9889 Other specified postprocedural states: Secondary | ICD-10-CM | POA: Diagnosis not present

## 2015-05-23 DIAGNOSIS — F419 Anxiety disorder, unspecified: Secondary | ICD-10-CM | POA: Insufficient documentation

## 2015-05-23 DIAGNOSIS — Z765 Malingerer [conscious simulation]: Secondary | ICD-10-CM

## 2015-05-23 NOTE — ED Provider Notes (Signed)
CSN: [id number]     Arrival date & time 05/23/15  1400 History   First MD Initiated Contact with Patient 05/23/15 1415     Chief Complaint  Patient presents with  . Pain     (Consider location/radiation/quality/duration/timing/severity/associated sxs/prior Treatment) The history is provided by the patient and medical records.   29 year old female with history of osteosarcoma, kidney stones, hepatitis C, anxiety, presenting to the ED requesting refill of her pain medications. Patient states last month she had to have emergency surgery in [place] on her left ankle for a bony tumor resection.  She states she had had the surgery done because her grandmother was dying in [place] at the time. She states she will now be seeing a physician at [facility] in [place] to take over her surgical care. She states she has also been set up with a pain management physician, but will not be seeing them for another 10 days. She states she needs refills of her pain medication to get her through until her appointment. She denies any fever or chills.  VSS.  Past Medical History  Diagnosis Date  . Osteosarcoma ([place])   . Renal disorder   . Cancer ([place])   . Hepatitis C 2013  . Kidney stones   . Anxiety    Past Surgical History  Procedure Laterality Date  . Cystoscopy    . Lithotripsy    . Bone cyst excision      grafting with calcium sulfate pellets   Family History  Problem Relation Age of Onset  . Heart disease Father    Social History  Substance Use Topics  . Smoking status: Current Every Day Smoker -- 1.00 packs/day for 9 years    Types: Cigarettes  . Smokeless tobacco: Never Used  . Alcohol Use: No   OB History    No data available     Review of Systems  Constitutional:       Med refill  All other systems reviewed and are negative.     Allergies  Tylenol; Amoxicillin-pot clavulanate; Codeine; Hydrocodone; Penicillins; Rocephin; Toradol; Tramadol; Zofran; and  Zofran  Home Medications   Prior to Admission medications   Medication Sig Start Date End Date Taking? Authorizing Provider  gabapentin (NEURONTIN) 300 MG capsule Take 300 mg by mouth 3 (three) times daily.   Yes Historical Provider, MD  ALPRAZolam Duanne Moron) 1 MG tablet Take 1 mg by mouth at bedtime as needed for sleep.    Historical Provider, MD  LORazepam (ATIVAN) 1 MG tablet Take 1 tablet (1 mg total) by mouth every 8 (eight) hours as needed for anxiety. 09/06/12   Ripley Fraise, MD  oxyCODONE (ROXICODONE) 15 MG immediate release tablet Take 1 tablet (15 mg total) by mouth every 6 (six) hours as needed for pain. 05/20/13 09/09/14  Midge Minium, MD  oxyCODONE (ROXICODONE) 15 MG immediate release tablet Take 15 mg by mouth every 6 (six) hours as needed for pain.    Historical Provider, MD  oxymorphone (OPANA) 5 MG tablet Take 1 tablet (5 mg total) by mouth 2 (two) times daily. Patient taking differently: Take 20 mg by mouth 2 (two) times daily.  01/29/12 05/20/13  Silverio Decamp, MD  ribavirin (REBETOL) 200 MG capsule Take 200 mg by mouth 5 (five) times daily. Eight times daily per pt    Historical Provider, MD   BP 123/80 mmHg  Pulse 111  Temp(Src) 98.2 F (36.8 C) (Oral)  Resp 18  Ht 5\' 2"  (1.575  m)  Wt 46.267 kg  BMI 18.65 kg/m2  SpO2 100%  LMP 04/23/2015   Physical Exam  Constitutional: She is oriented to person, place, and time. She appears well-developed and well-nourished. No distress.  Appears drowsy  HENT:  Head: Normocephalic and atraumatic.  Mouth/Throat: Oropharynx is clear and moist.  Eyes: Conjunctivae and EOM are normal. Pupils are equal, round, and reactive to light.  Pupils pinpoint but symmetric  Neck: Normal range of motion.  Cardiovascular: Normal rate, regular rhythm and normal heart sounds.   Pulmonary/Chest: Effort normal and breath sounds normal. No respiratory distress. She has no wheezes.  Abdominal: Soft. Bowel sounds are normal.   Musculoskeletal: Normal range of motion.  Neurological: She is alert and oriented to person, place, and time.  Skin: Skin is warm and dry. She is not diaphoretic.  Psychiatric: Her affect is angry. She is aggressive.  Hostile, aggressive  Nursing note and vitals reviewed.   ED Course  Procedures (including critical care time) Labs Review Labs Reviewed - No data to display  Imaging Review No results found. I have personally reviewed and evaluated these images and lab results as part of my medical decision-making.   EKG Interpretation None      MDM   Final diagnoses:  Drug-seeking behavior   2:30 PM Patient here requesting refill of her chronic pain meds.  She states she has upcoming appt with pain management in 10 days.  Patient appears drowsy initially, pupils pinpoint.  Upon chart review, patient has been seen multiple times for pain related complaints, displays of drug seeking behavior, and has been requesting refills from various facilities including [place], [place], and [facility] hospital with 2 visits in the same day recently on 05/18/15, at both of which she expressed violent behavior per provider notes.  After discussing with patient that it is against hospital policy to refill chronic pain medications and confronting patient about her recent ED visits, patient becomes very hostile and aggressive, got in my face and demanded refills of her medications.  Patient proceeds to walk out and "find the attending" while calling me a psychopath and cursing. Security was present outside the door and escorted patient out of ED.  Larene Pickett, PA-C 05/23/15 [place], MD 05/24/15 [phone]

## 2015-05-23 NOTE — ED Notes (Addendum)
Security called to pts room to escort PA in room for exam, d/t hx of just  leaving [facility] and [facility] and having violent behavior at both when denied medication refill

## 2015-05-23 NOTE — ED Notes (Signed)
Pa  at bedside. 

## 2015-05-23 NOTE — ED Notes (Signed)
Security at bedside with PA , pt came out of room following and cursing at [place] , security escorted pt off property

## 2015-05-23 NOTE — ED Notes (Signed)
Pt demanding refill on pain medication , states she is out of her medication, refusing to shut exam room door , states " i want to see my doctor now , i cant wait, I need my roxicodone and soma and my other medication"

## 2015-05-23 NOTE — ED Notes (Signed)
Patient reports that about a month and 3 days ago she had surgery to her ankle for the removal of a tumor. The patient reports that that she continues to have pain to the surgery area. The patient reports she needs enough meds to make it to her pain clinic dr. The patient reports that she needs a refill on her pain medications and her anxiety meds.

## 2015-05-23 NOTE — ED Notes (Signed)
Patient states that she is out of all of her medications and she is way past due to get them filled. PAtient states that she had to have a emergency surgery in [place] and she was not allowed to bring any meds back.

## 2015-05-24 ENCOUNTER — Encounter: Payer: Self-pay | Admitting: Emergency Medicine

## 2015-05-24 ENCOUNTER — Ambulatory Visit
Admission: EM | Admit: 2015-05-24 | Discharge: 2015-05-24 | Disposition: A | Discharge status: Home / Self Care | Payer: Self-pay | Attending: Family Medicine | Admitting: Family Medicine

## 2015-05-24 DIAGNOSIS — G893 Neoplasm related pain (acute) (chronic): Secondary | ICD-10-CM

## 2015-05-24 DIAGNOSIS — Z85830 Personal history of malignant neoplasm of bone: Secondary | ICD-10-CM

## 2015-05-24 NOTE — Discharge Instructions (Signed)

## 2015-05-24 NOTE — ED Notes (Signed)
Pt reports Left lower leg pain, had bone graft surgery in [place] March 2016. Pt osteosarcoma. Pt is seen at [facility] for oncology and has an upcoming appointment in pain clinic in January 14. Next oncologist appointment in Jan. previous meds from prescribed by Dr. Berdine Addison.

## 2015-05-24 NOTE — ED Notes (Addendum)
Witnessed MD Dr. Alveta Heimlich inform pt that he was unable to examine her and cannot write her for narcotics prescription.  Pt then refused to stay for discharge papers or to sign and walked out of clinic telling nurse to "back off and let me walk" and was going to go out wrong exit to go back by staff area, and nurse instructed pt to exit through lobby.

## 2015-05-24 NOTE — ED Notes (Addendum)
Pt very anxious at registration, stated she felt like she was going to pass out, pt brought to room in w/c . Pt states she needs medication refills, asked how many doctors we had, how many PAs, if they were female or female, pt asked to see the female doctor. Pt says she called the ER and was told they will only give 3 days worth of pain meds and she should to go the urgent care. Pt had parked her car in front of the urgent care at curb, security officer went to park her car in a space and returned. Then security guard went to her car to retrieve her purse because it had the empty med bottles.   Pt left in exam room while nurse and MDs discussing cases and patient found walking in hall and told to go back to room.  Per front desk, pt did not have ID or know her ss number.
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
## 2015-05-26 ENCOUNTER — Encounter (HOSPITAL_BASED_OUTPATIENT_CLINIC_OR_DEPARTMENT_OTHER): Payer: Self-pay | Admitting: Emergency Medicine

## 2017-03-24 ENCOUNTER — Encounter (HOSPITAL_BASED_OUTPATIENT_CLINIC_OR_DEPARTMENT_OTHER): Payer: Self-pay | Admitting: *Deleted

## 2017-03-24 ENCOUNTER — Emergency Department (HOSPITAL_BASED_OUTPATIENT_CLINIC_OR_DEPARTMENT_OTHER): Payer: Medicaid Other

## 2017-03-24 ENCOUNTER — Emergency Department (HOSPITAL_BASED_OUTPATIENT_CLINIC_OR_DEPARTMENT_OTHER)
Admission: EM | Admit: 2017-03-24 | Discharge: 2017-03-24 | Disposition: A | Discharge status: Home / Self Care | Payer: Medicaid Other | Attending: Emergency Medicine | Admitting: Emergency Medicine

## 2017-03-24 DIAGNOSIS — R1084 Generalized abdominal pain: Secondary | ICD-10-CM | POA: Diagnosis present

## 2017-03-24 DIAGNOSIS — F17210 Nicotine dependence, cigarettes, uncomplicated: Secondary | ICD-10-CM | POA: Diagnosis not present

## 2017-03-24 DIAGNOSIS — N3000 Acute cystitis without hematuria: Secondary | ICD-10-CM | POA: Diagnosis not present

## 2017-03-24 DIAGNOSIS — Z79899 Other long term (current) drug therapy: Secondary | ICD-10-CM | POA: Insufficient documentation

## 2017-03-24 HISTORY — DX: Other chronic pain: G89.29

## 2017-03-24 HISTORY — DX: Major depressive disorder, single episode, unspecified: F32.9

## 2017-03-24 HISTORY — DX: Malingerer (conscious simulation): Z76.5

## 2017-03-24 HISTORY — DX: Opioid dependence, uncomplicated: F11.20

## 2017-03-24 HISTORY — DX: Obsessive-compulsive disorder, unspecified: F42.9

## 2017-03-24 HISTORY — DX: Depression, unspecified: F32.A

## 2017-03-24 LAB — URINALYSIS, ROUTINE W REFLEX MICROSCOPIC
Glucose, UA: 100 mg/dL — AB
Hgb urine dipstick: NEGATIVE
Ketones, ur: 15 mg/dL — AB
Leukocytes, UA: NEGATIVE
Nitrite: NEGATIVE
Protein, ur: NEGATIVE mg/dL
Specific Gravity, Urine: 1.02 (ref 1.005–1.030)
pH: 6 (ref 5.0–8.0)

## 2017-03-24 LAB — CBC
HCT: 39.3 % (ref 36.0–46.0)
Hemoglobin: 12.9 g/dL (ref 12.0–15.0)
MCH: 27.6 pg (ref 26.0–34.0)
MCHC: 32.8 g/dL (ref 30.0–36.0)
MCV: 84.2 fL (ref 78.0–100.0)
Platelets: 385 10*3/uL (ref 150–400)
RBC: 4.67 MIL/uL (ref 3.87–5.11)
RDW: 14 % (ref 11.5–15.5)
WBC: 9.5 10*3/uL (ref 4.0–10.5)

## 2017-03-24 LAB — BASIC METABOLIC PANEL
Anion gap: 8 (ref 5–15)
BUN: 7 mg/dL (ref 6–20)
CO2: 25 mmol/L (ref 22–32)
Calcium: 9.2 mg/dL (ref 8.9–10.3)
Chloride: 102 mmol/L (ref 101–111)
Creatinine, Ser: 0.63 mg/dL (ref 0.44–1.00)
GFR calc Af Amer: >60 mL/min (ref >60)
GFR calc non Af Amer: >60 mL/min (ref >60)
Glucose, Bld: 114 mg/dL — ABNORMAL HIGH (ref 65–99)
Potassium: 3.8 mmol/L (ref 3.5–5.1)
Sodium: 135 mmol/L (ref 135–145)

## 2017-03-24 LAB — PREGNANCY, URINE: Preg Test, Ur: NEGATIVE

## 2017-03-24 MED ORDER — SODIUM CHLORIDE 0.9 % IV BOLUS (SEPSIS)
1000.0000 mL | Freq: Once | INTRAVENOUS | Status: AC
  Administered 2017-03-24: 1000 mL via INTRAVENOUS

## 2017-03-24 MED ORDER — NITROFURANTOIN MONOHYD MACRO 100 MG PO CAPS: 100.0000 mg | ORAL_CAPSULE | Freq: Two times a day (BID) | ORAL | 0 refills | Status: AC

## 2017-03-24 MED ORDER — PROCHLORPERAZINE EDISYLATE 5 MG/ML IJ SOLN
10.0000 mg | Freq: Four times a day (QID) | INTRAMUSCULAR | Status: DC | PRN
  Administered 2017-03-24: 10 mg via INTRAVENOUS
  Filled 2017-03-24: qty 2

## 2017-03-24 MED ORDER — NITROFURANTOIN MONOHYD MACRO 100 MG PO CAPS
100.0000 mg | ORAL_CAPSULE | Freq: Once | ORAL | Status: AC
  Administered 2017-03-24: 100 mg via ORAL
  Filled 2017-03-24: qty 1

## 2017-03-24 MED FILL — NITROFURANTOIN MONO-MCR 100: 100 | 7 days supply | Qty: 14 | Fill #0

## 2017-03-24 NOTE — ED Notes (Signed)
ED Provider at bedside. 

## 2017-03-24 NOTE — ED Provider Notes (Signed)
[place] DEPT [facility] Provider Note   CSN: [id number] Arrival date & time: 03/24/17  2831     History   Chief Complaint Chief Complaint  Patient presents with  . Flank Pain    HPI Beth Berg is a 31 y.o. female.  HPI Patient presents to the emergency department complains of worsening left flank pain with some radiation into her left groin over the past 5 days.  She reports urinary frequency and dysuria.  Denies fever.  Reports some nausea without vomiting.  Denies diarrhea.  She is on chronic oxycodone.  Symptoms are moderate in severity.  She has a history of both urinary tract infections and kidney stones.   Past Medical History:  Diagnosis Date  . Anxiety   . Chronic pain   . Depression   . Drug-seeking behavior   . Hepatitis C 2013  . Kidney stones   . Narcotic dependence ([place])   . OCD (obsessive compulsive disorder)   . Osteosarcoma ([place])   . Renal disorder     Patient Active Problem List   Diagnosis Date Noted  . 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 Hepatitis C, chronic ([place]) 05/20/2013  . Chronic back pain 01/29/2012  . Mood disorder ([place]) 01/29/2012    Past Surgical History:  Procedure Laterality Date  . BONE CYST EXCISION     grafting with calcium sulfate pellets  . CESAREAN SECTION    . CYSTOSCOPY    . HIP SURGERY Left   . LEG SURGERY Left   . LITHOTRIPSY      OB History    Gravida Para Term Preterm AB Living   0 0 0 0 0     SAB TAB Ectopic Multiple Live Births   0 0 0           Home Medications    Prior to Admission medications   Medication Sig Start Date End Date Taking? Authorizing Provider  ALPRAZolam Duanne Moron) 1 MG tablet Take 1 mg by mouth at bedtime as needed for sleep.    [provider]  gabapentin (NEURONTIN) 300 MG capsule Take 300 mg by mouth 3 (three) times daily.    [provider]  LORazepam (ATIVAN) 1 MG tablet Take 1 tablet (1 mg total) by mouth every 8 (eight) hours as needed for anxiety. 09/06/12    Ripley Fraise, MD  nitrofurantoin, macrocrystal-monohydrate, (MACROBID) 100 MG capsule Take 1 capsule (100 mg total) by mouth 2 (two) times daily. 03/24/17   Jola Schmidt, MD  oxyCODONE (ROXICODONE) 15 MG immediate release tablet Take 1 tablet (15 mg total) by mouth every 6 (six) hours as needed for pain. 05/20/13 09/09/14  Midge Minium, MD  oxyCODONE (ROXICODONE) 15 MG immediate release tablet Take 15 mg by mouth every 6 (six) hours as needed for pain.    [provider]  OXYCODONE HCL PO Take 30 mg by mouth 5 (five) times daily.    [provider]  oxymorphone (OPANA ER) 30 MG 12 hr tablet Take 30 mg by mouth every 12 (twelve) hours.    [provider]  oxymorphone (OPANA) 5 MG tablet Take 1 tablet (5 mg total) by mouth 2 (two) times daily. Patient taking differently: Take 20 mg by mouth 2 (two) times daily.  01/29/12 05/20/13  Silverio Decamp, MD  ribavirin (REBETOL) 200 MG capsule Take 200 mg by mouth 5 (five) times daily. Eight times daily per pt    [provider]  ribavirin (REBETOL) 200 MG capsule Take 200  mg by mouth 5 (five) times daily.    [provider]    Family History Family History  Problem Relation Age of Onset  . Heart disease Father     Social History Social History  Substance Use Topics  . Smoking status: Current Every Day Smoker    Packs/day: 1.00    Years: 9.00    Types: Cigarettes  . Smokeless tobacco: Never Used  . Alcohol use No     Allergies   Tylenol [acetaminophen]; Acetaminophen; Amoxicillin-pot clavulanate; Codeine; Hydrocodone; Penicillins; Rocephin [ceftriaxone sodium in dextrose]; Rocephin [ceftriaxone]; Toradol [ketorolac tromethamine]; Tramadol; Tramadol; Zofran [ondansetron hcl]; and Zofran [ondansetron hcl]   Review of Systems Review of Systems  All other systems reviewed and are negative.    Physical Exam Updated Vital Signs BP 115/74 (BP Location: Left Arm)   Pulse 100    Temp 98.5 F (36.9 C) (Oral)   Ht 5\' 2"  (1.575 m)   Wt 52.2 kg (115 lb)   LMP 03/03/2017 (Approximate)   SpO2 100%   BMI 21.03 kg/m   Physical Exam  Constitutional: She is oriented to person, place, and time. She appears well-developed and well-nourished. No distress.  HENT:  Head: Normocephalic and atraumatic.  Eyes: EOM are normal.  Neck: Normal range of motion.  Cardiovascular: Normal rate and regular rhythm.   Pulmonary/Chest: Effort normal and breath sounds normal.  Abdominal: Soft. She exhibits no distension. There is no tenderness.  Genitourinary:  Genitourinary Comments: No significant flank tenderness  Musculoskeletal: Normal range of motion.  Neurological: She is alert and oriented to person, place, and time.  Skin: Skin is warm and dry.  Psychiatric: She has a normal mood and affect. Judgment normal.  Nursing note and vitals reviewed.    ED Treatments / Results  Labs (all labs ordered are listed, but only abnormal results are displayed) Labs Reviewed  URINALYSIS, ROUTINE W REFLEX MICROSCOPIC - Abnormal; Notable for the following:       Result Value   Glucose, UA 100 (*)    Bilirubin Urine SMALL (*)    Ketones, ur 15 (*)    All other components within normal limits  URINE CULTURE  PREGNANCY, URINE  CBC  BASIC METABOLIC PANEL    EKG  EKG Interpretation None       Radiology Ct Renal Stone Study  Result Date: 03/24/2017 CLINICAL DATA:  Left flank pain for 5 days.  Fever. EXAM: CT ABDOMEN AND PELVIS WITHOUT CONTRAST TECHNIQUE: Multidetector CT imaging of the abdomen and pelvis was performed following the standard protocol without IV contrast. COMPARISON:  None. FINDINGS: Lower chest:  Negative. Hepatobiliary: No focal liver abnormality.No evidence of biliary obstruction or stone. Pancreas: Unremarkable. Spleen: Unremarkable. Adrenals/Urinary Tract: Negative adrenals. No hydronephrosis or stone. Unremarkable bladder. Stomach/Bowel:  No obstruction. No  appendicitis. Vascular/Lymphatic: No acute vascular abnormality. No mass or adenopathy. Reproductive:No pathologic findings. Other: No ascites or pneumoperitoneum. Musculoskeletal: Negative. IMPRESSION: Negative exam.  No explanation for flank pain. Electronically Signed   By: Monte Fantasia M.D.   On: 03/24/2017 07:38    Procedures Procedures (including critical care time)  Medications Ordered in ED Medications  prochlorperazine (COMPAZINE) injection 10 mg (10 mg Intravenous Given 03/24/17 0743)  nitrofurantoin (macrocrystal-monohydrate) (MACROBID) capsule 100 mg (not administered)  sodium chloride 0.9 % bolus 1,000 mL (1,000 mLs Intravenous New Bag/Given 03/24/17 0742)     Initial Impression / Assessment and Plan / ED Course  I have reviewed the triage vital signs and the nursing notes.  Pertinent labs & imaging results that were available during my care of the patient were reviewed by me and considered in my medical decision making (see chart for details).     Feels better.  Urine culture sent.  CT negative for stone or other pathology.  Given patient's symptoms of urinary frequency and dysuria should based treated with Macrobid despite her urine looking okay here in the emergency department.  Close primary care follow-up.  Last narcotic prescription was 03/18/2017, 15 mg oxycodone,  quantity 120.     Final Clinical Impressions(s) / ED Diagnoses   Final diagnoses:  Acute cystitis without hematuria    New Prescriptions New Prescriptions   NITROFURANTOIN, MACROCRYSTAL-MONOHYDRATE, (MACROBID) 100 MG CAPSULE    Take 1 capsule (100 mg total) by mouth 2 (two) times daily.     Jola Schmidt, MD 03/24/17 [id number]

## 2017-03-24 NOTE — ED Triage Notes (Signed)
C/o left flank pain that started 5 days ago. Describes as a sharp pain that comes and goes. Describes as sharp. Urinary frequency but states voided a small amount. Fevers unknown but states she was "sweating" last night. Has not taken any pain meds pta. C/o nausea. Denies vomiting.

## 2017-03-26 LAB — URINE CULTURE: Culture: NO GROWTH

## 2017-08-16 ENCOUNTER — Emergency Department (HOSPITAL_COMMUNITY)
Admission: EM | Admit: 2017-08-16 | Discharge: 2017-08-16 | Disposition: A | Discharge status: Home / Self Care | Payer: Medicaid Other | Attending: Emergency Medicine | Admitting: Emergency Medicine

## 2017-08-16 ENCOUNTER — Encounter (HOSPITAL_COMMUNITY): Payer: Self-pay | Admitting: Emergency Medicine

## 2017-08-16 DIAGNOSIS — Z760 Encounter for issue of repeat prescription: Secondary | ICD-10-CM | POA: Diagnosis present

## 2017-08-16 DIAGNOSIS — Z79899 Other long term (current) drug therapy: Secondary | ICD-10-CM | POA: Insufficient documentation

## 2017-08-16 DIAGNOSIS — Z85830 Personal history of malignant neoplasm of bone: Secondary | ICD-10-CM | POA: Insufficient documentation

## 2017-08-16 DIAGNOSIS — F17210 Nicotine dependence, cigarettes, uncomplicated: Secondary | ICD-10-CM | POA: Diagnosis not present

## 2017-08-16 MED ORDER — OXYCODONE HCL 5 MG PO TABS
5.0000 mg | ORAL_TABLET | Freq: Once | ORAL | Status: DC
  Filled 2017-08-16: qty 1

## 2017-08-16 MED ORDER — OXYCODONE HCL 5 MG PO TABS
10.0000 mg | ORAL_TABLET | Freq: Once | ORAL | Status: AC
  Administered 2017-08-16: 10 mg via ORAL
  Filled 2017-08-16: qty 2

## 2017-08-16 NOTE — ED Provider Notes (Signed)
[facility] DEPT Provider Note   CSN: [id number] Arrival date & time: 08/16/17  1732     History   Chief Complaint Chief Complaint  Patient presents with  . Medication Refill    HPI Beth Berg is a 32 y.o. female who presents to the ED for medication refill. Patient reports she is out of her oxycodone. Patient reports that she called her doctor and they told her to come to the ED for a refill.   HPI  Past Medical History:  Diagnosis Date  . Anxiety   . Chronic pain   . Depression   . Drug-seeking behavior   . Hepatitis C 2013  . Kidney stones   . Narcotic dependence ([place])   . OCD (obsessive compulsive disorder)   . 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 Renal disorder     Patient Active Problem List   Diagnosis Date Noted  . 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 Hepatitis C, chronic ([place]) 05/20/2013  . Chronic back pain 01/29/2012  . Mood disorder ([place]) 01/29/2012    Past Surgical History:  Procedure Laterality Date  . BONE CYST EXCISION     grafting with calcium sulfate pellets  . CESAREAN SECTION    . CYSTOSCOPY    . HIP SURGERY Left   . LEG SURGERY Left   . LITHOTRIPSY      OB History    Gravida Para Term Preterm AB Living   0 0 0 0 0     SAB TAB Ectopic Multiple Live Births   0 0 0           Home Medications    Prior to Admission medications   Medication Sig Start Date End Date Taking? Authorizing Provider  ALPRAZolam Duanne Moron) 1 MG tablet Take 1 mg by mouth at bedtime as needed for sleep.    [provider]  gabapentin (NEURONTIN) 300 MG capsule Take 300 mg by mouth 3 (three) times daily.    [provider]  LORazepam (ATIVAN) 1 MG tablet Take 1 tablet (1 mg total) by mouth every 8 (eight) hours as needed for anxiety. 09/06/12   Ripley Fraise, MD  nitrofurantoin, macrocrystal-monohydrate, (MACROBID) 100 MG capsule Take 1 capsule (100 mg total) by mouth 2 (two) times daily. 03/24/17   Jola Schmidt, MD    oxyCODONE (ROXICODONE) 15 MG immediate release tablet Take 1 tablet (15 mg total) by mouth every 6 (six) hours as needed for pain. 05/20/13 09/09/14  Midge Minium, MD  oxyCODONE (ROXICODONE) 15 MG immediate release tablet Take 15 mg by mouth every 6 (six) hours as needed for pain.    [provider]  OXYCODONE HCL PO Take 30 mg by mouth 5 (five) times daily.    [provider]  oxymorphone (OPANA ER) 30 MG 12 hr tablet Take 30 mg by mouth every 12 (twelve) hours.    [provider]  oxymorphone (OPANA) 5 MG tablet Take 1 tablet (5 mg total) by mouth 2 (two) times daily. Patient taking differently: Take 20 mg by mouth 2 (two) times daily.  01/29/12 05/20/13  Silverio Decamp, MD  ribavirin (REBETOL) 200 MG capsule Take 200 mg by mouth 5 (five) times daily. Eight times daily per pt    [provider]  ribavirin (REBETOL) 200 MG capsule Take 200 mg by mouth 5 (five) times daily.    [provider]    Family History Family History  Problem Relation Age of Onset  . Heart disease  Father     Social History Social History   Tobacco Use  . Smoking status: Current Every Day Smoker    Packs/day: 1.00    Years: 9.00    Pack years: 9.00    Types: Cigarettes  . Smokeless tobacco: Never Used  Substance Use Topics  . Alcohol use: No  . Drug use: No     Allergies   Tylenol [acetaminophen]; Acetaminophen; Amoxicillin-pot clavulanate; Codeine; Hydrocodone; Penicillins; Rocephin [ceftriaxone sodium in dextrose]; Rocephin [ceftriaxone]; Toradol [ketorolac tromethamine]; Tramadol; Tramadol; Zofran [ondansetron hcl]; and Zofran [ondansetron hcl]   Review of Systems Review of Systems  Musculoskeletal: Positive for arthralgias.     Physical Exam Updated Vital Signs BP 108/89 (BP Location: Left Arm)   Pulse (!) 102   Temp 98.1 F (36.7 C) (Oral)   Resp 18   SpO2 96%   Physical Exam  Constitutional: No distress.  HENT:  Head:  Normocephalic.  Eyes: EOM are normal.  Neck: Neck supple.  Cardiovascular: Tachycardia present.  Pulmonary/Chest: Effort normal.  Musculoskeletal: Normal range of motion.  Neurological: She is alert.  Skin: Skin is warm and dry.  Nursing note and vitals reviewed.    ED Treatments / Results  Labs (all labs ordered are listed, but only abnormal results are displayed) Labs Reviewed - No data to display  Radiology No results found.  Procedures Procedures (including critical care time)  Medications Ordered in ED Medications  oxyCODONE (Oxy IR/ROXICODONE) immediate release tablet 10 mg (not administered)     Initial Impression / Assessment and Plan / ED Course  I have reviewed the triage vital signs and the nursing notes. 32 y.o. female here for medication refill for oxycodone 15 mg tabs. I discussed this case with Dr. Eulis Foster and we are not able to refill opoid prescriptions in the ED. Encouraged patient to call her doctor that manages her pain to discuss medication.    Final Clinical Impressions(s) / ED Diagnoses   Final diagnoses:  Medication refill    ED Discharge Orders    None       Debroah Baller [place], [place] 08/16/17 1854    Daleen Bo, MD 08/16/17 2312

## 2017-08-16 NOTE — ED Triage Notes (Signed)
Pt has bone cancer and out of pain medications:oxycodone 15mg  3-4 times per day, was told to go to ED to get refill.  Has been out 5-6 days.
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
## 2017-08-16 NOTE — Discharge Instructions (Signed)
We do not refill narcotics in the emergency department. You will need to contact your doctor that manages you pain or the doctor on call for him for additional medication.

## 2018-09-28 IMAGING — CT CT RENAL STONE PROTOCOL
2 of 4 series · 17 of 46 positions shown, 19 images · non-contrast
Comparison: None.

CLINICAL DATA: Left flank pain for 5 days.  Fever.

EXAM:
CT ABDOMEN AND PELVIS WITHOUT CONTRAST
TECHNIQUE: Multidetector CT imaging of the abdomen and pelvis was performed
following the standard protocol without IV contrast.

[Series 2: axial st · axial · 0.66mm/px · z∈[-596,-172]mm · 14 of 93 slices shown, 16 images]
[im 4/93  soft-tissue]
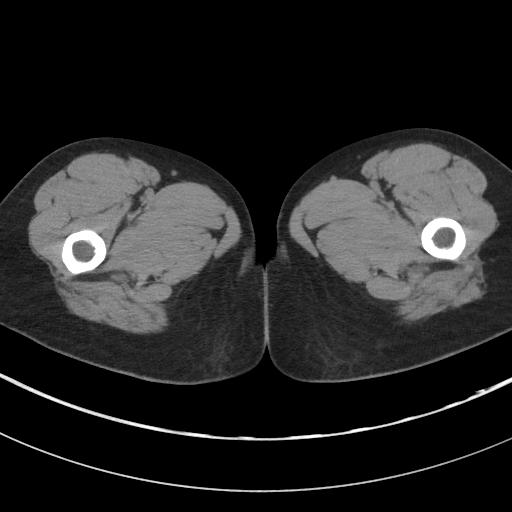
[im 4/93  bone]
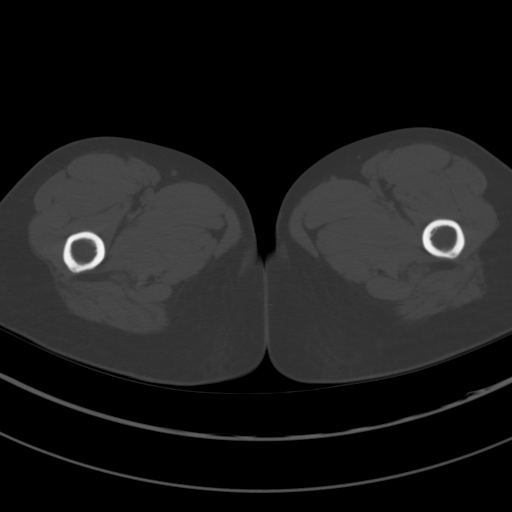
[im 12/93  soft-tissue]
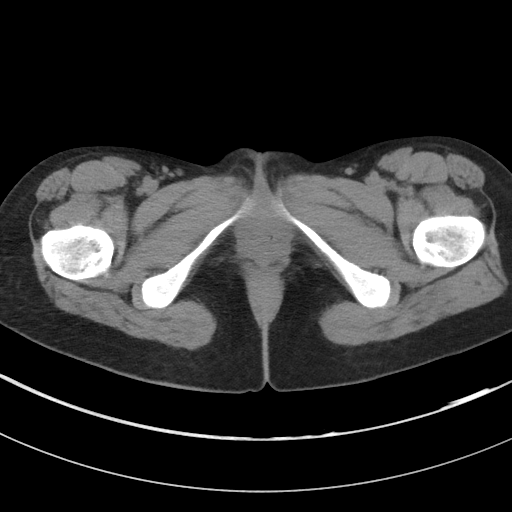
[im 19/93  soft-tissue]
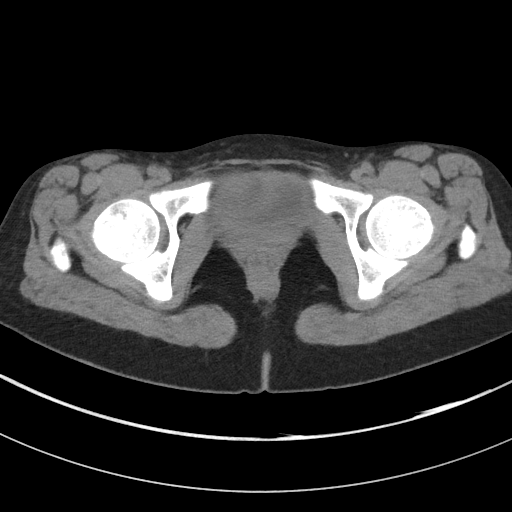
[im 26/93  soft-tissue]
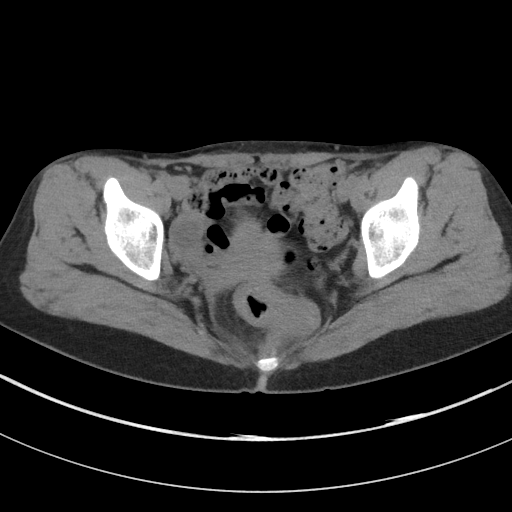
[im 30/93  soft-tissue]
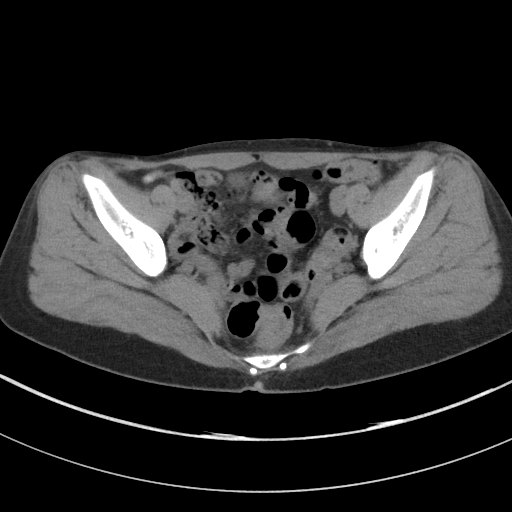
[im 37/93  soft-tissue]
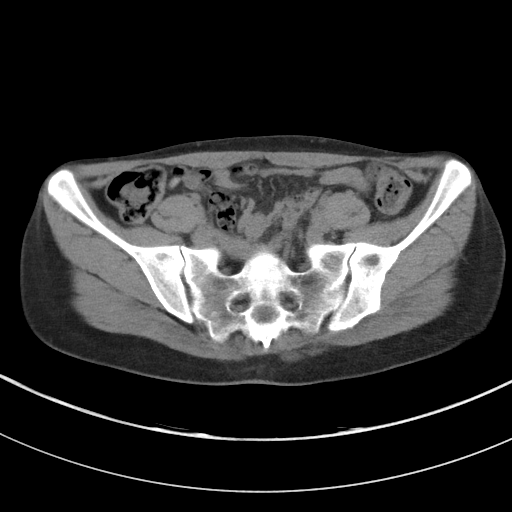
[im 45/93  soft-tissue]
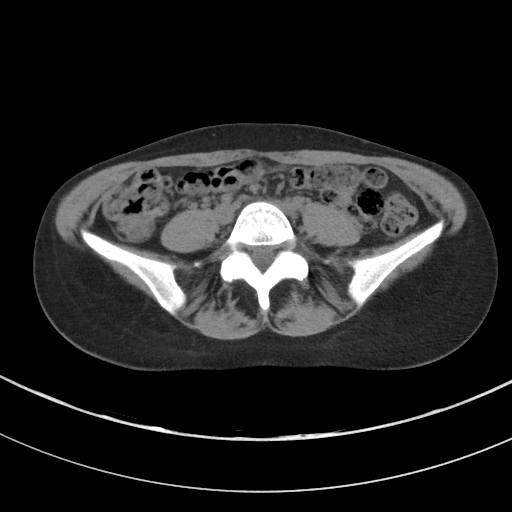
[im 48/93  soft-tissue]
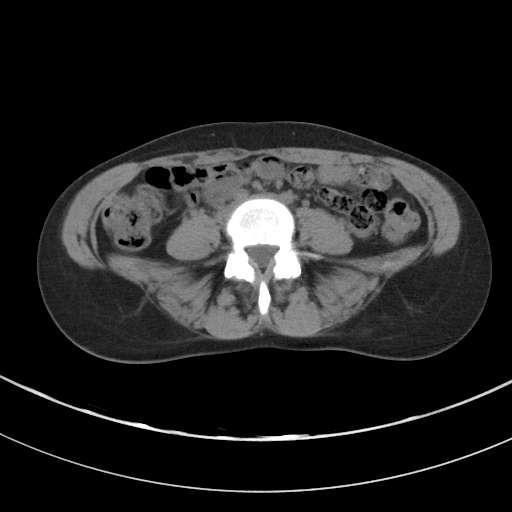
[im 56/93  soft-tissue]
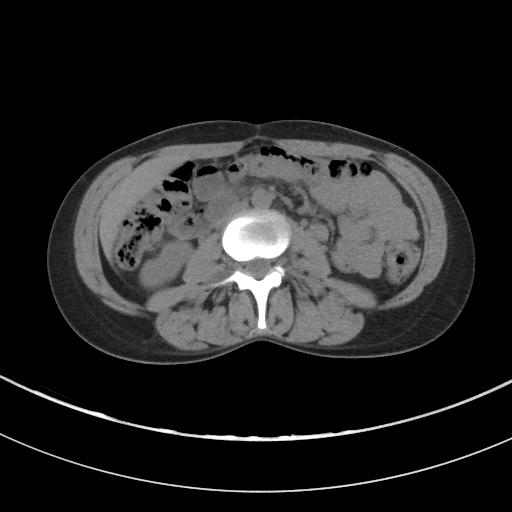
[im 56/93  bone]
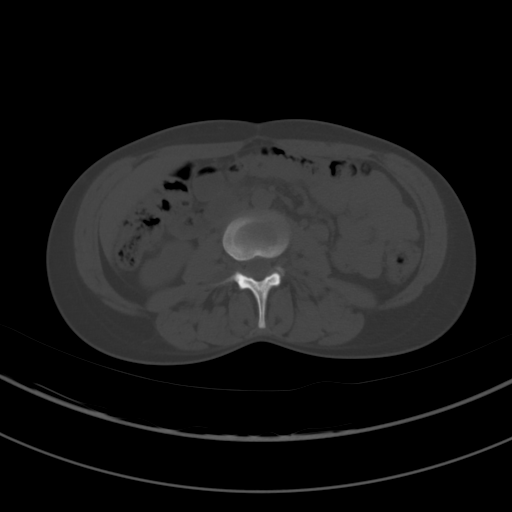
[im 63/93  soft-tissue]
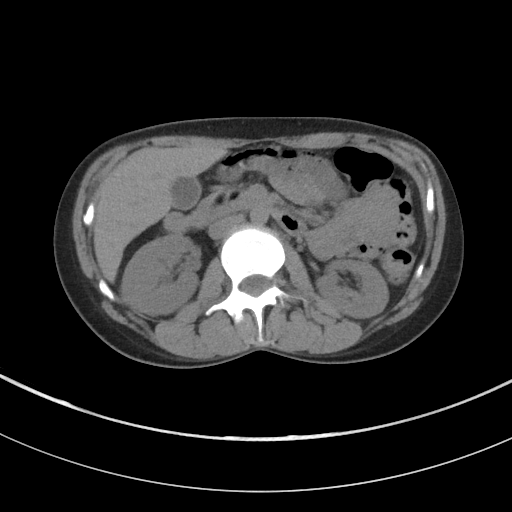
[im 70/93  soft-tissue]
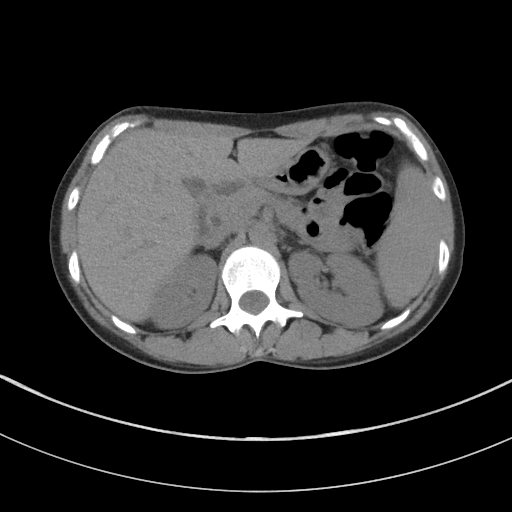
[im 74/93  soft-tissue]
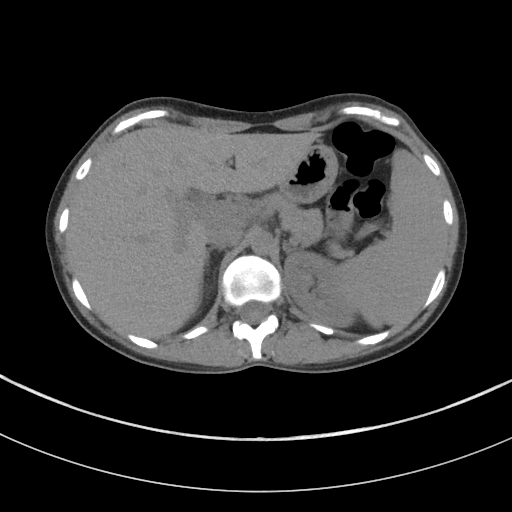
[im 81/93  soft-tissue]
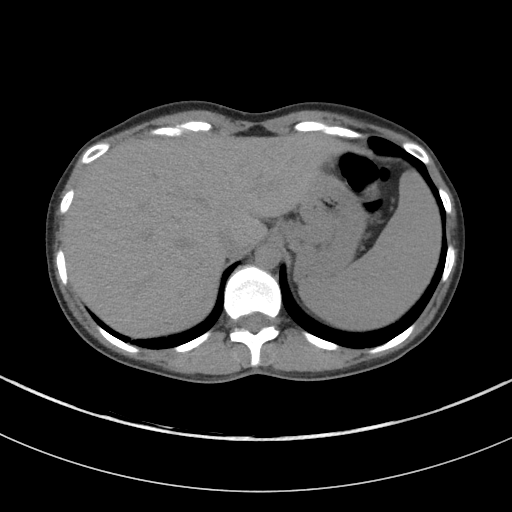
[im 89/93  soft-tissue]
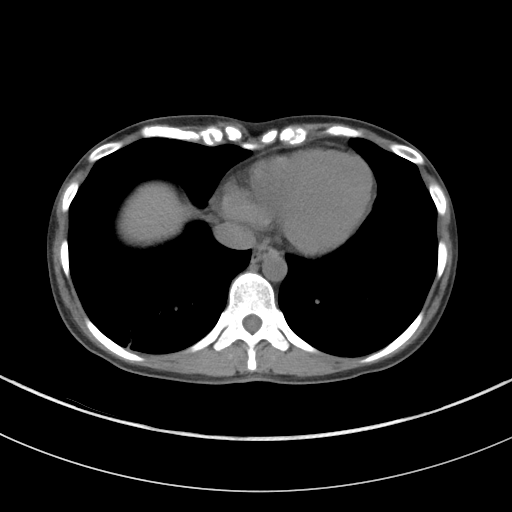

[Series 3: coronal st · coronal · 0.77mm/px · 3 of 61 slices shown]
[im 21/61  soft-tissue]
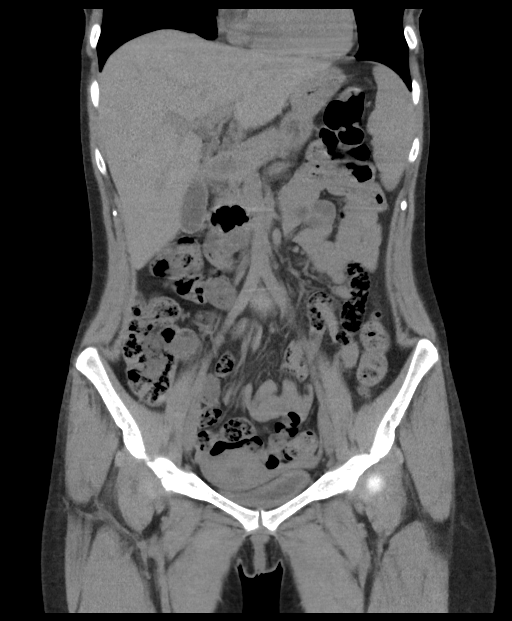
[im 27/61  soft-tissue]
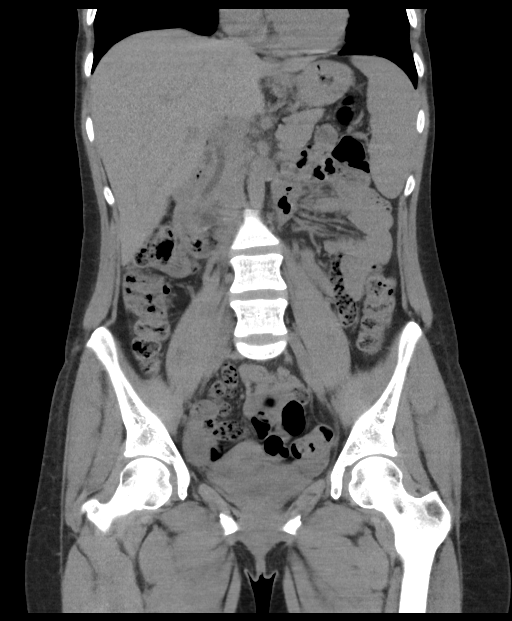
[im 34/61  soft-tissue]
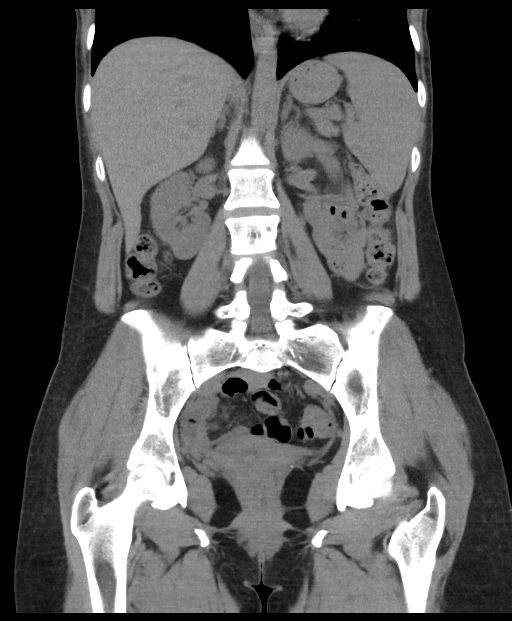

[17 of 46 positions shown; findings below may reference images not displayed]

FINDINGS: Lower chest:  Negative.

Hepatobiliary: No focal liver abnormality.No evidence of biliary
obstruction or stone.

Pancreas: Unremarkable.

Spleen: Unremarkable.

Adrenals/Urinary Tract: Negative adrenals. No hydronephrosis or
stone. Unremarkable bladder.

Stomach/Bowel:  No obstruction. No appendicitis.

Vascular/Lymphatic: No acute vascular abnormality. No mass or
adenopathy.

Reproductive:No pathologic findings.

Other: No ascites or pneumoperitoneum.

Musculoskeletal: Negative.
IMPRESSION: Negative exam.  No explanation for flank pain.

## 2023-07-18 ENCOUNTER — Encounter (HOSPITAL_COMMUNITY): Payer: Self-pay | Admitting: Emergency Medicine

## 2024-06-10 ENCOUNTER — Telehealth

## 2024-06-18 NOTE — Telephone Encounter (Signed)
 Patient called asking to be seen today.  Nothing available with the [facility] team until Monday and she states she cannot wait unless Dr Burton would be willing to write a letter for her without seeing her.  She is having issues with palpitations and heart racing.  States she is running a temperature.  She is wanting a letter stating it is not in her best interest to be incarcerated, she can do house arrest.  Feels she is not being given her medications properly.  Asked that this be marked as urgent.

## 2024-06-21 NOTE — Telephone Encounter (Signed)
 Patient added on for tomorrow at 330 with [facility].  Thank you

## 2024-07-01 ENCOUNTER — Ambulatory Visit: Admission: EM | Admit: 2024-07-01 | Discharge: 2024-07-01 | Disposition: A | Discharge status: Home / Self Care

## 2024-07-01 ENCOUNTER — Other Ambulatory Visit: Payer: Self-pay

## 2024-07-01 DIAGNOSIS — Z765 Malingerer [conscious simulation]: Secondary | ICD-10-CM

## 2024-07-01 NOTE — ED Notes (Signed)
 As I was taking thee pt's vital signs I noticed the pt has track marks on the posterior of her hands bilat.
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
## 2024-07-02 ENCOUNTER — Emergency Department (HOSPITAL_COMMUNITY)
Admission: EM | Admit: 2024-07-02 | Discharge: 2024-07-02 | Disposition: A | Discharge status: Home / Self Care | Attending: Emergency Medicine | Admitting: Emergency Medicine

## 2024-07-02 DIAGNOSIS — R079 Chest pain, unspecified: Secondary | ICD-10-CM | POA: Diagnosis present

## 2024-07-02 DIAGNOSIS — R52 Pain, unspecified: Secondary | ICD-10-CM
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
## 2024-07-02 NOTE — Discharge Instructions (Signed)
Follow-up with your care team 
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
